# Patient Record
Sex: Female | Born: 1935 | Race: White | Hispanic: No | Marital: Single | Smoking: Former smoker
Health system: Southern US, Community
[De-identification: ages and names within clinical notes are randomized; demographics above are authoritative.]

## PROBLEM LIST (undated history)

## (undated) DIAGNOSIS — K219 Gastro-esophageal reflux disease without esophagitis: Secondary | ICD-10-CM

## (undated) DIAGNOSIS — M810 Age-related osteoporosis without current pathological fracture: Secondary | ICD-10-CM

## (undated) DIAGNOSIS — R112 Nausea with vomiting, unspecified: Secondary | ICD-10-CM

## (undated) DIAGNOSIS — M1990 Unspecified osteoarthritis, unspecified site: Secondary | ICD-10-CM

## (undated) DIAGNOSIS — Z8719 Personal history of other diseases of the digestive system: Secondary | ICD-10-CM

## (undated) DIAGNOSIS — Z9889 Other specified postprocedural states: Secondary | ICD-10-CM

## (undated) HISTORY — PX: OTHER SURGICAL HISTORY: SHX169

## (undated) HISTORY — PX: CATARACT EXTRACTION W/ INTRAOCULAR LENS  IMPLANT, BILATERAL: SHX1307

## (undated) HISTORY — PX: BUNIONECTOMY: SHX129

## (undated) HISTORY — PX: TUBAL LIGATION: SHX77

## (undated) HISTORY — PX: CERVICAL CONE BIOPSY: SUR198

## (undated) HISTORY — PX: APPENDECTOMY: SHX54

## (undated) HISTORY — PX: HEMORRHOID SURGERY: SHX153

## (undated) HISTORY — PX: CHOLECYSTECTOMY: SHX55

## (undated) HISTORY — PX: ERCP: SHX60

## (undated) HISTORY — PX: HERNIA REPAIR: SHX51

## (undated) HISTORY — PX: DILATION AND CURETTAGE OF UTERUS: SHX78

## (undated) HISTORY — PX: FOOT SURGERY: SHX648

---

## 1999-11-12 ENCOUNTER — Encounter: Admission: RE | Admit: 1999-11-12 | Discharge: 1999-11-12 | Payer: Self-pay | Admitting: Emergency Medicine

## 1999-11-12 ENCOUNTER — Encounter: Payer: Self-pay | Admitting: Emergency Medicine

## 1999-12-13 ENCOUNTER — Ambulatory Visit (HOSPITAL_COMMUNITY): Admission: RE | Admit: 1999-12-13 | Discharge: 1999-12-13 | Payer: Self-pay | Admitting: Gastroenterology

## 1999-12-14 ENCOUNTER — Encounter: Payer: Self-pay | Admitting: Gastroenterology

## 1999-12-14 ENCOUNTER — Ambulatory Visit (HOSPITAL_COMMUNITY): Admission: RE | Admit: 1999-12-14 | Discharge: 1999-12-14 | Payer: Self-pay | Admitting: Gastroenterology

## 2000-01-11 ENCOUNTER — Ambulatory Visit (HOSPITAL_COMMUNITY): Admission: RE | Admit: 2000-01-11 | Discharge: 2000-01-12 | Payer: Self-pay

## 2000-01-18 ENCOUNTER — Encounter: Payer: Self-pay | Admitting: Gastroenterology

## 2000-01-18 ENCOUNTER — Ambulatory Visit (HOSPITAL_COMMUNITY): Admission: RE | Admit: 2000-01-18 | Discharge: 2000-01-18 | Payer: Self-pay | Admitting: Gastroenterology

## 2000-01-19 ENCOUNTER — Observation Stay (HOSPITAL_COMMUNITY): Admission: EM | Admit: 2000-01-19 | Discharge: 2000-01-20 | Payer: Self-pay | Admitting: Emergency Medicine

## 2000-01-19 ENCOUNTER — Encounter: Payer: Self-pay | Admitting: Emergency Medicine

## 2000-04-08 ENCOUNTER — Other Ambulatory Visit: Admission: RE | Admit: 2000-04-08 | Discharge: 2000-04-08 | Payer: Self-pay | Admitting: Obstetrics and Gynecology

## 2000-09-15 ENCOUNTER — Encounter: Admission: RE | Admit: 2000-09-15 | Discharge: 2000-09-15 | Payer: Self-pay | Admitting: Pulmonary Disease

## 2000-09-15 ENCOUNTER — Encounter: Payer: Self-pay | Admitting: Pulmonary Disease

## 2000-11-12 ENCOUNTER — Encounter: Payer: Self-pay | Admitting: Obstetrics and Gynecology

## 2000-11-12 ENCOUNTER — Encounter: Admission: RE | Admit: 2000-11-12 | Discharge: 2000-11-12 | Payer: Self-pay | Admitting: Obstetrics and Gynecology

## 2001-04-09 ENCOUNTER — Other Ambulatory Visit: Admission: RE | Admit: 2001-04-09 | Discharge: 2001-04-09 | Payer: Self-pay | Admitting: Internal Medicine

## 2015-03-07 ENCOUNTER — Other Ambulatory Visit: Payer: Self-pay | Admitting: Family Medicine

## 2015-03-07 DIAGNOSIS — R059 Cough, unspecified: Secondary | ICD-10-CM

## 2015-03-07 DIAGNOSIS — R05 Cough: Secondary | ICD-10-CM

## 2015-03-09 ENCOUNTER — Ambulatory Visit
Admission: RE | Admit: 2015-03-09 | Discharge: 2015-03-09 | Disposition: A | Discharge status: Home / Self Care | Payer: PPO | Source: Ambulatory Visit | Attending: Family Medicine | Admitting: Family Medicine

## 2015-03-09 ENCOUNTER — Other Ambulatory Visit: Payer: Self-pay | Admitting: Otolaryngology

## 2015-03-09 DIAGNOSIS — R059 Cough, unspecified: Secondary | ICD-10-CM

## 2015-03-09 DIAGNOSIS — R05 Cough: Secondary | ICD-10-CM

## 2015-08-02 DIAGNOSIS — J301 Allergic rhinitis due to pollen: Secondary | ICD-10-CM | POA: Diagnosis not present

## 2015-08-02 DIAGNOSIS — R05 Cough: Secondary | ICD-10-CM | POA: Diagnosis not present

## 2015-08-02 DIAGNOSIS — J849 Interstitial pulmonary disease, unspecified: Secondary | ICD-10-CM | POA: Diagnosis not present

## 2015-08-03 DIAGNOSIS — Z01818 Encounter for other preprocedural examination: Secondary | ICD-10-CM | POA: Diagnosis not present

## 2015-08-07 DIAGNOSIS — R05 Cough: Secondary | ICD-10-CM | POA: Diagnosis not present

## 2015-08-07 DIAGNOSIS — J849 Interstitial pulmonary disease, unspecified: Secondary | ICD-10-CM | POA: Diagnosis not present

## 2015-08-10 DIAGNOSIS — J4530 Mild persistent asthma, uncomplicated: Secondary | ICD-10-CM | POA: Diagnosis not present

## 2015-08-10 DIAGNOSIS — R05 Cough: Secondary | ICD-10-CM | POA: Diagnosis not present

## 2015-08-10 DIAGNOSIS — J8489 Other specified interstitial pulmonary diseases: Secondary | ICD-10-CM | POA: Diagnosis not present

## 2015-08-10 DIAGNOSIS — J302 Other seasonal allergic rhinitis: Secondary | ICD-10-CM | POA: Diagnosis not present

## 2015-08-15 DIAGNOSIS — H903 Sensorineural hearing loss, bilateral: Secondary | ICD-10-CM | POA: Diagnosis not present

## 2015-08-24 DIAGNOSIS — W010XXA Fall on same level from slipping, tripping and stumbling without subsequent striking against object, initial encounter: Secondary | ICD-10-CM | POA: Diagnosis not present

## 2015-08-24 DIAGNOSIS — Z471 Aftercare following joint replacement surgery: Secondary | ICD-10-CM | POA: Diagnosis not present

## 2015-08-24 DIAGNOSIS — Y929 Unspecified place or not applicable: Secondary | ICD-10-CM | POA: Diagnosis not present

## 2015-08-24 DIAGNOSIS — D169 Benign neoplasm of bone and articular cartilage, unspecified: Secondary | ICD-10-CM | POA: Diagnosis not present

## 2015-08-24 DIAGNOSIS — Z8249 Family history of ischemic heart disease and other diseases of the circulatory system: Secondary | ICD-10-CM | POA: Diagnosis not present

## 2015-08-24 DIAGNOSIS — S79911A Unspecified injury of right hip, initial encounter: Secondary | ICD-10-CM | POA: Diagnosis not present

## 2015-08-24 DIAGNOSIS — S72011A Unspecified intracapsular fracture of right femur, initial encounter for closed fracture: Secondary | ICD-10-CM | POA: Diagnosis not present

## 2015-08-24 DIAGNOSIS — R0609 Other forms of dyspnea: Secondary | ICD-10-CM | POA: Diagnosis not present

## 2015-08-24 DIAGNOSIS — Z01818 Encounter for other preprocedural examination: Secondary | ICD-10-CM | POA: Diagnosis not present

## 2015-08-24 DIAGNOSIS — M25551 Pain in right hip: Secondary | ICD-10-CM | POA: Diagnosis not present

## 2015-08-24 DIAGNOSIS — Z87891 Personal history of nicotine dependence: Secondary | ICD-10-CM | POA: Diagnosis not present

## 2015-08-24 DIAGNOSIS — M810 Age-related osteoporosis without current pathological fracture: Secondary | ICD-10-CM | POA: Diagnosis not present

## 2015-08-24 DIAGNOSIS — M25571 Pain in right ankle and joints of right foot: Secondary | ICD-10-CM | POA: Diagnosis not present

## 2015-08-24 DIAGNOSIS — W11XXXA Fall on and from ladder, initial encounter: Secondary | ICD-10-CM | POA: Diagnosis not present

## 2015-08-24 DIAGNOSIS — S72001A Fracture of unspecified part of neck of right femur, initial encounter for closed fracture: Secondary | ICD-10-CM | POA: Diagnosis not present

## 2015-08-24 DIAGNOSIS — Z96641 Presence of right artificial hip joint: Secondary | ICD-10-CM | POA: Diagnosis not present

## 2015-08-24 DIAGNOSIS — Z79899 Other long term (current) drug therapy: Secondary | ICD-10-CM | POA: Diagnosis not present

## 2015-08-24 DIAGNOSIS — Z6822 Body mass index (BMI) 22.0-22.9, adult: Secondary | ICD-10-CM | POA: Diagnosis not present

## 2015-08-24 DIAGNOSIS — Z880 Allergy status to penicillin: Secondary | ICD-10-CM | POA: Diagnosis not present

## 2015-08-24 DIAGNOSIS — Z885 Allergy status to narcotic agent status: Secondary | ICD-10-CM | POA: Diagnosis not present

## 2015-08-25 DIAGNOSIS — S72001A Fracture of unspecified part of neck of right femur, initial encounter for closed fracture: Secondary | ICD-10-CM | POA: Diagnosis not present

## 2015-08-25 DIAGNOSIS — R0609 Other forms of dyspnea: Secondary | ICD-10-CM | POA: Diagnosis not present

## 2015-08-25 DIAGNOSIS — M79672 Pain in left foot: Secondary | ICD-10-CM | POA: Diagnosis not present

## 2015-08-25 DIAGNOSIS — Z6826 Body mass index (BMI) 26.0-26.9, adult: Secondary | ICD-10-CM | POA: Diagnosis not present

## 2015-08-25 DIAGNOSIS — S99922A Unspecified injury of left foot, initial encounter: Secondary | ICD-10-CM | POA: Diagnosis not present

## 2015-08-25 DIAGNOSIS — M810 Age-related osteoporosis without current pathological fracture: Secondary | ICD-10-CM | POA: Diagnosis not present

## 2015-08-26 DIAGNOSIS — Z6826 Body mass index (BMI) 26.0-26.9, adult: Secondary | ICD-10-CM | POA: Diagnosis not present

## 2015-08-26 DIAGNOSIS — S72001A Fracture of unspecified part of neck of right femur, initial encounter for closed fracture: Secondary | ICD-10-CM | POA: Diagnosis not present

## 2015-08-26 DIAGNOSIS — M810 Age-related osteoporosis without current pathological fracture: Secondary | ICD-10-CM | POA: Diagnosis not present

## 2015-08-26 DIAGNOSIS — R0602 Shortness of breath: Secondary | ICD-10-CM | POA: Diagnosis not present

## 2015-08-27 DIAGNOSIS — R0602 Shortness of breath: Secondary | ICD-10-CM | POA: Diagnosis not present

## 2015-08-27 DIAGNOSIS — M810 Age-related osteoporosis without current pathological fracture: Secondary | ICD-10-CM | POA: Diagnosis not present

## 2015-08-27 DIAGNOSIS — Z6826 Body mass index (BMI) 26.0-26.9, adult: Secondary | ICD-10-CM | POA: Diagnosis not present

## 2015-08-27 DIAGNOSIS — S72001A Fracture of unspecified part of neck of right femur, initial encounter for closed fracture: Secondary | ICD-10-CM | POA: Diagnosis not present

## 2015-08-28 DIAGNOSIS — R0602 Shortness of breath: Secondary | ICD-10-CM | POA: Diagnosis not present

## 2015-08-28 DIAGNOSIS — S72001A Fracture of unspecified part of neck of right femur, initial encounter for closed fracture: Secondary | ICD-10-CM | POA: Diagnosis not present

## 2015-08-28 DIAGNOSIS — M810 Age-related osteoporosis without current pathological fracture: Secondary | ICD-10-CM | POA: Diagnosis not present

## 2015-08-28 DIAGNOSIS — Z6826 Body mass index (BMI) 26.0-26.9, adult: Secondary | ICD-10-CM | POA: Diagnosis not present

## 2015-08-29 DIAGNOSIS — Z880 Allergy status to penicillin: Secondary | ICD-10-CM | POA: Diagnosis not present

## 2015-08-29 DIAGNOSIS — M1711 Unilateral primary osteoarthritis, right knee: Secondary | ICD-10-CM | POA: Diagnosis not present

## 2015-08-29 DIAGNOSIS — S72001A Fracture of unspecified part of neck of right femur, initial encounter for closed fracture: Secondary | ICD-10-CM | POA: Diagnosis not present

## 2015-08-29 DIAGNOSIS — M810 Age-related osteoporosis without current pathological fracture: Secondary | ICD-10-CM | POA: Diagnosis not present

## 2015-08-29 DIAGNOSIS — Z6826 Body mass index (BMI) 26.0-26.9, adult: Secondary | ICD-10-CM | POA: Diagnosis not present

## 2015-08-29 DIAGNOSIS — Z96641 Presence of right artificial hip joint: Secondary | ICD-10-CM | POA: Diagnosis not present

## 2015-08-29 DIAGNOSIS — Z471 Aftercare following joint replacement surgery: Secondary | ICD-10-CM | POA: Diagnosis not present

## 2015-08-29 DIAGNOSIS — R0609 Other forms of dyspnea: Secondary | ICD-10-CM | POA: Diagnosis not present

## 2015-08-30 DIAGNOSIS — Z6826 Body mass index (BMI) 26.0-26.9, adult: Secondary | ICD-10-CM | POA: Diagnosis not present

## 2015-08-30 DIAGNOSIS — S72001A Fracture of unspecified part of neck of right femur, initial encounter for closed fracture: Secondary | ICD-10-CM | POA: Diagnosis not present

## 2015-08-31 DIAGNOSIS — S72001A Fracture of unspecified part of neck of right femur, initial encounter for closed fracture: Secondary | ICD-10-CM | POA: Diagnosis not present

## 2015-08-31 DIAGNOSIS — Z6826 Body mass index (BMI) 26.0-26.9, adult: Secondary | ICD-10-CM | POA: Diagnosis not present

## 2015-09-01 DIAGNOSIS — S72001A Fracture of unspecified part of neck of right femur, initial encounter for closed fracture: Secondary | ICD-10-CM | POA: Diagnosis not present

## 2015-09-01 DIAGNOSIS — Z6826 Body mass index (BMI) 26.0-26.9, adult: Secondary | ICD-10-CM | POA: Diagnosis not present

## 2015-09-02 DIAGNOSIS — S72001A Fracture of unspecified part of neck of right femur, initial encounter for closed fracture: Secondary | ICD-10-CM | POA: Diagnosis not present

## 2015-09-02 DIAGNOSIS — Z6826 Body mass index (BMI) 26.0-26.9, adult: Secondary | ICD-10-CM | POA: Diagnosis not present

## 2015-09-04 DIAGNOSIS — Z6823 Body mass index (BMI) 23.0-23.9, adult: Secondary | ICD-10-CM | POA: Diagnosis not present

## 2015-09-04 DIAGNOSIS — S72001A Fracture of unspecified part of neck of right femur, initial encounter for closed fracture: Secondary | ICD-10-CM | POA: Diagnosis not present

## 2015-09-05 DIAGNOSIS — Z6823 Body mass index (BMI) 23.0-23.9, adult: Secondary | ICD-10-CM | POA: Diagnosis not present

## 2015-09-05 DIAGNOSIS — S7290XA Unspecified fracture of unspecified femur, initial encounter for closed fracture: Secondary | ICD-10-CM | POA: Diagnosis not present

## 2015-09-05 DIAGNOSIS — S72001A Fracture of unspecified part of neck of right femur, initial encounter for closed fracture: Secondary | ICD-10-CM | POA: Diagnosis not present

## 2015-09-06 DIAGNOSIS — Z9181 History of falling: Secondary | ICD-10-CM | POA: Diagnosis not present

## 2015-09-06 DIAGNOSIS — Z7951 Long term (current) use of inhaled steroids: Secondary | ICD-10-CM | POA: Diagnosis not present

## 2015-09-06 DIAGNOSIS — M1711 Unilateral primary osteoarthritis, right knee: Secondary | ICD-10-CM | POA: Diagnosis not present

## 2015-09-06 DIAGNOSIS — S72001D Fracture of unspecified part of neck of right femur, subsequent encounter for closed fracture with routine healing: Secondary | ICD-10-CM | POA: Diagnosis not present

## 2015-09-08 DIAGNOSIS — Z7951 Long term (current) use of inhaled steroids: Secondary | ICD-10-CM | POA: Diagnosis not present

## 2015-09-08 DIAGNOSIS — Z9181 History of falling: Secondary | ICD-10-CM | POA: Diagnosis not present

## 2015-09-08 DIAGNOSIS — M1711 Unilateral primary osteoarthritis, right knee: Secondary | ICD-10-CM | POA: Diagnosis not present

## 2015-09-08 DIAGNOSIS — S72001D Fracture of unspecified part of neck of right femur, subsequent encounter for closed fracture with routine healing: Secondary | ICD-10-CM | POA: Diagnosis not present

## 2015-09-11 DIAGNOSIS — S72001D Fracture of unspecified part of neck of right femur, subsequent encounter for closed fracture with routine healing: Secondary | ICD-10-CM | POA: Diagnosis not present

## 2015-09-11 DIAGNOSIS — Z9181 History of falling: Secondary | ICD-10-CM | POA: Diagnosis not present

## 2015-09-11 DIAGNOSIS — Z7951 Long term (current) use of inhaled steroids: Secondary | ICD-10-CM | POA: Diagnosis not present

## 2015-09-11 DIAGNOSIS — M1711 Unilateral primary osteoarthritis, right knee: Secondary | ICD-10-CM | POA: Diagnosis not present

## 2015-09-12 DIAGNOSIS — S72001D Fracture of unspecified part of neck of right femur, subsequent encounter for closed fracture with routine healing: Secondary | ICD-10-CM | POA: Diagnosis not present

## 2015-09-13 DIAGNOSIS — S72001D Fracture of unspecified part of neck of right femur, subsequent encounter for closed fracture with routine healing: Secondary | ICD-10-CM | POA: Diagnosis not present

## 2015-09-13 DIAGNOSIS — Z7951 Long term (current) use of inhaled steroids: Secondary | ICD-10-CM | POA: Diagnosis not present

## 2015-09-13 DIAGNOSIS — M1711 Unilateral primary osteoarthritis, right knee: Secondary | ICD-10-CM | POA: Diagnosis not present

## 2015-09-13 DIAGNOSIS — Z9181 History of falling: Secondary | ICD-10-CM | POA: Diagnosis not present

## 2015-09-14 DIAGNOSIS — M1711 Unilateral primary osteoarthritis, right knee: Secondary | ICD-10-CM | POA: Diagnosis not present

## 2015-09-14 DIAGNOSIS — Z7951 Long term (current) use of inhaled steroids: Secondary | ICD-10-CM | POA: Diagnosis not present

## 2015-09-14 DIAGNOSIS — Z9181 History of falling: Secondary | ICD-10-CM | POA: Diagnosis not present

## 2015-09-14 DIAGNOSIS — S72001D Fracture of unspecified part of neck of right femur, subsequent encounter for closed fracture with routine healing: Secondary | ICD-10-CM | POA: Diagnosis not present

## 2015-09-15 DIAGNOSIS — M1711 Unilateral primary osteoarthritis, right knee: Secondary | ICD-10-CM | POA: Diagnosis not present

## 2015-09-15 DIAGNOSIS — Z9181 History of falling: Secondary | ICD-10-CM | POA: Diagnosis not present

## 2015-09-15 DIAGNOSIS — S72001D Fracture of unspecified part of neck of right femur, subsequent encounter for closed fracture with routine healing: Secondary | ICD-10-CM | POA: Diagnosis not present

## 2015-09-15 DIAGNOSIS — Z7951 Long term (current) use of inhaled steroids: Secondary | ICD-10-CM | POA: Diagnosis not present

## 2015-09-18 DIAGNOSIS — Z9181 History of falling: Secondary | ICD-10-CM | POA: Diagnosis not present

## 2015-09-18 DIAGNOSIS — M1711 Unilateral primary osteoarthritis, right knee: Secondary | ICD-10-CM | POA: Diagnosis not present

## 2015-09-18 DIAGNOSIS — S72001D Fracture of unspecified part of neck of right femur, subsequent encounter for closed fracture with routine healing: Secondary | ICD-10-CM | POA: Diagnosis not present

## 2015-09-18 DIAGNOSIS — Z7951 Long term (current) use of inhaled steroids: Secondary | ICD-10-CM | POA: Diagnosis not present

## 2015-09-22 DIAGNOSIS — Z7951 Long term (current) use of inhaled steroids: Secondary | ICD-10-CM | POA: Diagnosis not present

## 2015-09-22 DIAGNOSIS — M1711 Unilateral primary osteoarthritis, right knee: Secondary | ICD-10-CM | POA: Diagnosis not present

## 2015-09-22 DIAGNOSIS — Z9181 History of falling: Secondary | ICD-10-CM | POA: Diagnosis not present

## 2015-09-22 DIAGNOSIS — S72001D Fracture of unspecified part of neck of right femur, subsequent encounter for closed fracture with routine healing: Secondary | ICD-10-CM | POA: Diagnosis not present

## 2015-10-03 ENCOUNTER — Other Ambulatory Visit: Payer: Self-pay

## 2015-10-03 NOTE — Patient Outreach (Signed)
[facility]) Care Management  10/03/2015  NEVA PRUE 1933/08/11 [id number]  SUBJECTIVE:  Telephone call to patient regarding [facility] referral.  HIPAA verified with patient. Discussed and offered [facility] care management services to patient. Patient states she does not have any needs at this time. Patient states she had hip surgery on August 24, 2015. Patient states she has done very well after having inpatient rehab and 2 weeks of home therapy.  Patient states she is not walking with any assistive devices.  Patient verbalized appreciation of the call and agreed to receive [facility] care management letter and brochure for future reference.   PLAN:  RNCM will refer patient to Josepha Pigg to close due to refusal of services.  RNCM will notify patients primary MD of refusal of services.  RNCM will send patient [facility] care management letter and brochure as discussed.  Quinn Plowman RN,BSN,CCM [facility] Telephonic  [phone]

## 2015-10-25 DIAGNOSIS — R490 Dysphonia: Secondary | ICD-10-CM | POA: Diagnosis not present

## 2015-10-25 DIAGNOSIS — Z880 Allergy status to penicillin: Secondary | ICD-10-CM | POA: Diagnosis not present

## 2015-10-25 DIAGNOSIS — Z79899 Other long term (current) drug therapy: Secondary | ICD-10-CM | POA: Diagnosis not present

## 2015-10-25 DIAGNOSIS — Z87891 Personal history of nicotine dependence: Secondary | ICD-10-CM | POA: Diagnosis not present

## 2015-10-25 DIAGNOSIS — R05 Cough: Secondary | ICD-10-CM | POA: Diagnosis not present

## 2015-10-25 DIAGNOSIS — J384 Edema of larynx: Secondary | ICD-10-CM | POA: Diagnosis not present

## 2015-10-25 DIAGNOSIS — J3801 Paralysis of vocal cords and larynx, unilateral: Secondary | ICD-10-CM | POA: Diagnosis not present

## 2015-11-07 DIAGNOSIS — J3801 Paralysis of vocal cords and larynx, unilateral: Secondary | ICD-10-CM | POA: Diagnosis not present

## 2015-11-07 DIAGNOSIS — R490 Dysphonia: Secondary | ICD-10-CM | POA: Diagnosis not present

## 2015-11-07 DIAGNOSIS — R05 Cough: Secondary | ICD-10-CM | POA: Diagnosis not present

## 2015-11-22 DIAGNOSIS — R490 Dysphonia: Secondary | ICD-10-CM | POA: Diagnosis not present

## 2015-11-22 DIAGNOSIS — R05 Cough: Secondary | ICD-10-CM | POA: Diagnosis not present

## 2015-11-22 DIAGNOSIS — J3801 Paralysis of vocal cords and larynx, unilateral: Secondary | ICD-10-CM | POA: Diagnosis not present

## 2015-12-04 DIAGNOSIS — R05 Cough: Secondary | ICD-10-CM | POA: Diagnosis not present

## 2015-12-04 DIAGNOSIS — J302 Other seasonal allergic rhinitis: Secondary | ICD-10-CM | POA: Diagnosis not present

## 2015-12-18 DIAGNOSIS — M810 Age-related osteoporosis without current pathological fracture: Secondary | ICD-10-CM | POA: Diagnosis not present

## 2015-12-18 DIAGNOSIS — Z780 Asymptomatic menopausal state: Secondary | ICD-10-CM | POA: Diagnosis not present

## 2015-12-25 DIAGNOSIS — M810 Age-related osteoporosis without current pathological fracture: Secondary | ICD-10-CM | POA: Diagnosis not present

## 2016-01-08 DIAGNOSIS — M810 Age-related osteoporosis without current pathological fracture: Secondary | ICD-10-CM | POA: Diagnosis not present

## 2016-01-18 DIAGNOSIS — H3540 Unspecified peripheral retinal degeneration: Secondary | ICD-10-CM | POA: Diagnosis not present

## 2016-01-18 DIAGNOSIS — H04123 Dry eye syndrome of bilateral lacrimal glands: Secondary | ICD-10-CM | POA: Diagnosis not present

## 2016-01-18 DIAGNOSIS — H35461 Secondary vitreoretinal degeneration, right eye: Secondary | ICD-10-CM | POA: Diagnosis not present

## 2016-01-18 DIAGNOSIS — H5201 Hypermetropia, right eye: Secondary | ICD-10-CM | POA: Diagnosis not present

## 2016-01-18 DIAGNOSIS — H52223 Regular astigmatism, bilateral: Secondary | ICD-10-CM | POA: Diagnosis not present

## 2016-01-18 DIAGNOSIS — Z961 Presence of intraocular lens: Secondary | ICD-10-CM | POA: Diagnosis not present

## 2016-01-18 DIAGNOSIS — H5212 Myopia, left eye: Secondary | ICD-10-CM | POA: Diagnosis not present

## 2016-02-09 DIAGNOSIS — M1711 Unilateral primary osteoarthritis, right knee: Secondary | ICD-10-CM | POA: Diagnosis not present

## 2016-02-09 DIAGNOSIS — M25561 Pain in right knee: Secondary | ICD-10-CM | POA: Diagnosis not present

## 2016-05-14 DIAGNOSIS — J4520 Mild intermittent asthma, uncomplicated: Secondary | ICD-10-CM | POA: Diagnosis not present

## 2016-05-14 DIAGNOSIS — Z0000 Encounter for general adult medical examination without abnormal findings: Secondary | ICD-10-CM | POA: Diagnosis not present

## 2016-05-14 DIAGNOSIS — Z79899 Other long term (current) drug therapy: Secondary | ICD-10-CM | POA: Diagnosis not present

## 2016-05-14 DIAGNOSIS — E782 Mixed hyperlipidemia: Secondary | ICD-10-CM | POA: Diagnosis not present

## 2016-05-14 DIAGNOSIS — M810 Age-related osteoporosis without current pathological fracture: Secondary | ICD-10-CM | POA: Diagnosis not present

## 2016-07-09 DIAGNOSIS — H9113 Presbycusis, bilateral: Secondary | ICD-10-CM | POA: Diagnosis not present

## 2016-07-12 DIAGNOSIS — M810 Age-related osteoporosis without current pathological fracture: Secondary | ICD-10-CM | POA: Diagnosis not present

## 2016-07-23 DIAGNOSIS — Z1212 Encounter for screening for malignant neoplasm of rectum: Secondary | ICD-10-CM | POA: Diagnosis not present

## 2016-07-24 DIAGNOSIS — H16223 Keratoconjunctivitis sicca, not specified as Sjogren's, bilateral: Secondary | ICD-10-CM | POA: Diagnosis not present

## 2016-07-24 DIAGNOSIS — H16143 Punctate keratitis, bilateral: Secondary | ICD-10-CM | POA: Diagnosis not present

## 2016-07-24 DIAGNOSIS — H01003 Unspecified blepharitis right eye, unspecified eyelid: Secondary | ICD-10-CM | POA: Diagnosis not present

## 2016-08-23 DIAGNOSIS — K219 Gastro-esophageal reflux disease without esophagitis: Secondary | ICD-10-CM | POA: Diagnosis not present

## 2016-08-23 DIAGNOSIS — K5909 Other constipation: Secondary | ICD-10-CM | POA: Diagnosis not present

## 2016-08-23 DIAGNOSIS — R195 Other fecal abnormalities: Secondary | ICD-10-CM | POA: Diagnosis not present

## 2016-08-23 DIAGNOSIS — Z800 Family history of malignant neoplasm of digestive organs: Secondary | ICD-10-CM | POA: Diagnosis not present

## 2016-09-06 DIAGNOSIS — K219 Gastro-esophageal reflux disease without esophagitis: Secondary | ICD-10-CM | POA: Diagnosis not present

## 2016-09-06 DIAGNOSIS — K5730 Diverticulosis of large intestine without perforation or abscess without bleeding: Secondary | ICD-10-CM | POA: Diagnosis not present

## 2016-09-06 DIAGNOSIS — Z800 Family history of malignant neoplasm of digestive organs: Secondary | ICD-10-CM | POA: Diagnosis not present

## 2016-09-06 DIAGNOSIS — K5790 Diverticulosis of intestine, part unspecified, without perforation or abscess without bleeding: Secondary | ICD-10-CM | POA: Diagnosis not present

## 2016-09-06 DIAGNOSIS — K921 Melena: Secondary | ICD-10-CM | POA: Diagnosis not present

## 2016-09-06 DIAGNOSIS — K648 Other hemorrhoids: Secondary | ICD-10-CM | POA: Diagnosis not present

## 2016-09-11 DIAGNOSIS — M25561 Pain in right knee: Secondary | ICD-10-CM | POA: Diagnosis not present

## 2016-09-11 DIAGNOSIS — Z6823 Body mass index (BMI) 23.0-23.9, adult: Secondary | ICD-10-CM | POA: Diagnosis not present

## 2016-09-11 DIAGNOSIS — M1711 Unilateral primary osteoarthritis, right knee: Secondary | ICD-10-CM | POA: Diagnosis not present

## 2016-09-18 DIAGNOSIS — H16143 Punctate keratitis, bilateral: Secondary | ICD-10-CM | POA: Diagnosis not present

## 2016-09-18 DIAGNOSIS — H01003 Unspecified blepharitis right eye, unspecified eyelid: Secondary | ICD-10-CM | POA: Diagnosis not present

## 2016-09-18 DIAGNOSIS — H16223 Keratoconjunctivitis sicca, not specified as Sjogren's, bilateral: Secondary | ICD-10-CM | POA: Diagnosis not present

## 2016-10-04 DIAGNOSIS — T148XXA Other injury of unspecified body region, initial encounter: Secondary | ICD-10-CM | POA: Diagnosis not present

## 2016-11-27 DIAGNOSIS — R1010 Upper abdominal pain, unspecified: Secondary | ICD-10-CM | POA: Diagnosis not present

## 2016-11-28 DIAGNOSIS — R1010 Upper abdominal pain, unspecified: Secondary | ICD-10-CM | POA: Diagnosis not present

## 2016-12-09 DIAGNOSIS — M25561 Pain in right knee: Secondary | ICD-10-CM | POA: Diagnosis not present

## 2016-12-09 DIAGNOSIS — Z6823 Body mass index (BMI) 23.0-23.9, adult: Secondary | ICD-10-CM | POA: Diagnosis not present

## 2016-12-09 DIAGNOSIS — M1711 Unilateral primary osteoarthritis, right knee: Secondary | ICD-10-CM | POA: Diagnosis not present

## 2017-03-07 DIAGNOSIS — S86211A Strain of muscle(s) and tendon(s) of anterior muscle group at lower leg level, right leg, initial encounter: Secondary | ICD-10-CM | POA: Diagnosis not present

## 2017-03-07 DIAGNOSIS — M1711 Unilateral primary osteoarthritis, right knee: Secondary | ICD-10-CM | POA: Diagnosis not present

## 2017-03-07 DIAGNOSIS — M79661 Pain in right lower leg: Secondary | ICD-10-CM | POA: Diagnosis not present

## 2017-03-07 DIAGNOSIS — M19071 Primary osteoarthritis, right ankle and foot: Secondary | ICD-10-CM | POA: Diagnosis not present

## 2017-03-07 DIAGNOSIS — M7671 Peroneal tendinitis, right leg: Secondary | ICD-10-CM | POA: Diagnosis not present

## 2017-04-21 DIAGNOSIS — H16223 Keratoconjunctivitis sicca, not specified as Sjogren's, bilateral: Secondary | ICD-10-CM | POA: Diagnosis not present

## 2017-04-21 DIAGNOSIS — H26493 Other secondary cataract, bilateral: Secondary | ICD-10-CM | POA: Diagnosis not present

## 2017-04-21 DIAGNOSIS — H26492 Other secondary cataract, left eye: Secondary | ICD-10-CM | POA: Diagnosis not present

## 2017-04-21 DIAGNOSIS — H01009 Unspecified blepharitis unspecified eye, unspecified eyelid: Secondary | ICD-10-CM | POA: Diagnosis not present

## 2017-04-21 DIAGNOSIS — H35371 Puckering of macula, right eye: Secondary | ICD-10-CM | POA: Diagnosis not present

## 2017-04-28 DIAGNOSIS — H26492 Other secondary cataract, left eye: Secondary | ICD-10-CM | POA: Diagnosis not present

## 2017-06-19 DIAGNOSIS — G8929 Other chronic pain: Secondary | ICD-10-CM | POA: Diagnosis not present

## 2017-06-19 DIAGNOSIS — M25561 Pain in right knee: Secondary | ICD-10-CM | POA: Diagnosis not present

## 2017-06-19 DIAGNOSIS — M25461 Effusion, right knee: Secondary | ICD-10-CM | POA: Diagnosis not present

## 2017-06-19 DIAGNOSIS — M21062 Valgus deformity, not elsewhere classified, left knee: Secondary | ICD-10-CM | POA: Diagnosis not present

## 2017-06-19 DIAGNOSIS — M85861 Other specified disorders of bone density and structure, right lower leg: Secondary | ICD-10-CM | POA: Diagnosis not present

## 2017-06-19 DIAGNOSIS — M1711 Unilateral primary osteoarthritis, right knee: Secondary | ICD-10-CM | POA: Diagnosis not present

## 2017-07-01 DIAGNOSIS — E782 Mixed hyperlipidemia: Secondary | ICD-10-CM | POA: Diagnosis not present

## 2017-07-01 DIAGNOSIS — Z79899 Other long term (current) drug therapy: Secondary | ICD-10-CM | POA: Diagnosis not present

## 2017-07-01 DIAGNOSIS — M810 Age-related osteoporosis without current pathological fracture: Secondary | ICD-10-CM | POA: Diagnosis not present

## 2017-07-01 DIAGNOSIS — Z0000 Encounter for general adult medical examination without abnormal findings: Secondary | ICD-10-CM | POA: Diagnosis not present

## 2017-07-01 DIAGNOSIS — K219 Gastro-esophageal reflux disease without esophagitis: Secondary | ICD-10-CM | POA: Diagnosis not present

## 2017-07-01 DIAGNOSIS — Z01810 Encounter for preprocedural cardiovascular examination: Secondary | ICD-10-CM | POA: Diagnosis not present

## 2017-07-02 DIAGNOSIS — E782 Mixed hyperlipidemia: Secondary | ICD-10-CM | POA: Diagnosis not present

## 2017-07-07 ENCOUNTER — Other Ambulatory Visit: Payer: Self-pay | Admitting: Orthopedic Surgery

## 2017-08-08 ENCOUNTER — Encounter (HOSPITAL_COMMUNITY): Payer: Self-pay | Admitting: *Deleted

## 2017-08-08 ENCOUNTER — Encounter (HOSPITAL_COMMUNITY)
Admission: RE | Admit: 2017-08-08 | Discharge: 2017-08-08 | Disposition: A | Discharge status: Home / Self Care | Payer: PPO | Source: Ambulatory Visit | Attending: Orthopedic Surgery | Admitting: Orthopedic Surgery

## 2017-08-08 ENCOUNTER — Other Ambulatory Visit: Payer: Self-pay

## 2017-08-08 DIAGNOSIS — Z01812 Encounter for preprocedural laboratory examination: Secondary | ICD-10-CM | POA: Diagnosis not present

## 2017-08-08 HISTORY — DX: Personal history of other diseases of the digestive system: Z87.19

## 2017-08-08 HISTORY — DX: Nausea with vomiting, unspecified: R11.2

## 2017-08-08 HISTORY — DX: Gastro-esophageal reflux disease without esophagitis: K21.9

## 2017-08-08 HISTORY — DX: Other specified postprocedural states: Z98.890

## 2017-08-08 HISTORY — DX: Unspecified osteoarthritis, unspecified site: M19.90

## 2017-08-08 HISTORY — DX: Age-related osteoporosis without current pathological fracture: M81.0

## 2017-08-08 LAB — CBC WITH DIFFERENTIAL/PLATELET
BASOS ABS: 0 10*3/uL (ref 0.0–0.1)
Basophils Relative: 1 %
Eosinophils Absolute: 0.2 10*3/uL (ref 0.0–0.7)
Eosinophils Relative: 2 %
HEMATOCRIT: 40 % (ref 36.0–46.0)
HEMOGLOBIN: 13.4 g/dL (ref 12.0–15.0)
LYMPHS PCT: 32 %
Lymphs Abs: 2 10*3/uL (ref 0.7–4.0)
MCH: 31.3 pg (ref 26.0–34.0)
MCHC: 33.5 g/dL (ref 30.0–36.0)
MCV: 93.5 fL (ref 78.0–100.0)
MONO ABS: 0.5 10*3/uL (ref 0.1–1.0)
Monocytes Relative: 8 %
NEUTROS PCT: 57 %
Neutro Abs: 3.5 10*3/uL (ref 1.7–7.7)
Platelets: 266 10*3/uL (ref 150–400)
RBC: 4.28 MIL/uL (ref 3.87–5.11)
RDW: 12.3 % (ref 11.5–15.5)
WBC: 6.1 10*3/uL (ref 4.0–10.5)

## 2017-08-08 LAB — COMPREHENSIVE METABOLIC PANEL
ALT: 13 U/L — AB (ref 14–54)
AST: 24 U/L (ref 15–41)
Albumin: 4.2 g/dL (ref 3.5–5.0)
Alkaline Phosphatase: 86 U/L (ref 38–126)
Anion gap: 12 (ref 5–15)
BILIRUBIN TOTAL: 0.4 mg/dL (ref 0.3–1.2)
BUN: 14 mg/dL (ref 6–20)
CO2: 22 mmol/L (ref 22–32)
CREATININE: 0.81 mg/dL (ref 0.44–1.00)
Calcium: 9.3 mg/dL (ref 8.9–10.3)
Chloride: 106 mmol/L (ref 101–111)
GFR calc Af Amer: >60 mL/min (ref >60)
GLUCOSE: 90 mg/dL (ref 65–99)
Potassium: 4.2 mmol/L (ref 3.5–5.1)
Sodium: 140 mmol/L (ref 135–145)
TOTAL PROTEIN: 6.7 g/dL (ref 6.5–8.1)

## 2017-08-08 LAB — SURGICAL PCR SCREEN
MRSA, PCR: NEGATIVE
Staphylococcus aureus: NEGATIVE

## 2017-08-08 NOTE — Pre-Procedure Instructions (Signed)
Breanna Montes  08/08/2017      [facility] [place], [address] [place] [address] Phone: [phone] Fax: [phone]    Your procedure is scheduled on  Monday 08/18/17  Report to [facility] Admitting at 615 A.M.  Call this number if you have problems the morning of surgery:  [phone]   Remember:  Do not eat food or drink liquids after midnight.  Take these medicines the morning of surgery with A SIP OF WATER - EYE DROPS IF NEEDED, OMEPRAZOLE (PRILOSEC)   Do not wear jewelry, make-up or nail polish.  Do not wear lotions, powders, or perfumes, or deodorant.  Do not shave 48 hours prior to surgery.  Men may shave face and neck.  Do not bring valuables to the hospital.  [facility] is not responsible for any belongings or valuables.  Contacts, dentures or bridgework may not be worn into surgery.  Leave your suitcase in the car.  After surgery it may be brought to your room.  For patients admitted to the hospital, discharge time will be determined by your treatment team.  Patients discharged the day of surgery will not be allowed to drive home.   Name and phone number of your driver:    Special instructions:  [place] - Preparing for Surgery  Before surgery, you can play an important role.  Because skin is not sterile, your skin needs to be as free of germs as possible.  You can reduce the number of germs on you skin by washing with CHG (chlorahexidine gluconate) soap before surgery.  CHG is an antiseptic cleaner which kills germs and bonds with the skin to continue killing germs even after washing.  Please DO NOT use if you have an allergy to CHG or antibacterial soaps.  If your skin becomes reddened/irritated stop using the CHG and inform your nurse when you arrive at Short Stay.  Do not shave (including legs and underarms) for at least 48 hours prior to the first CHG shower.  You may shave your  face.  Please follow these instructions carefully:   1.  Shower with CHG Soap the night before surgery and the                                morning of Surgery.  2.  If you choose to wash your hair, wash your hair first as usual with your       normal shampoo.  3.  After you shampoo, rinse your hair and body thoroughly to remove the                      Shampoo.  4.  Use CHG as you would any other liquid soap.  You can apply chg directly       to the skin and wash gently with scrungie or a clean washcloth.  5.  Apply the CHG Soap to your body ONLY FROM THE NECK DOWN.        Do not use on open wounds or open sores.  Avoid contact with your eyes,       ears, mouth and genitals (private parts).  Wash genitals (private parts)       with your normal soap.  6.  Wash thoroughly, paying special attention to the area where your surgery  will be performed.  7.  Thoroughly rinse your body with warm water from the neck down.  8.  DO NOT shower/wash with your normal soap after using and rinsing off       the CHG Soap.  9.  Pat yourself dry with a clean towel.            10.  Wear clean pajamas.            11.  Place clean sheets on your bed the night of your first shower and do not        sleep with pets.  Day of Surgery  Do not apply any lotions/deoderants the morning of surgery.  Please wear clean clothes to the hospital/surgery center.    Please read over the following fact sheets that you were given. MRSA Information and Surgical Site Infection Prevention

## 2017-08-12 DIAGNOSIS — Z1231 Encounter for screening mammogram for malignant neoplasm of breast: Secondary | ICD-10-CM | POA: Diagnosis not present

## 2017-08-15 MED ORDER — DEXAMETHASONE SODIUM PHOSPHATE 10 MG/ML IJ SOLN: 8.0000 mg | INTRAMUSCULAR | Status: DC

## 2017-08-15 MED ORDER — GABAPENTIN 300 MG PO CAPS
300.0000 mg | ORAL_CAPSULE | ORAL | Status: AC
  Administered 2017-08-18: 300 mg via ORAL
  Filled 2017-08-15: qty 1

## 2017-08-15 MED ORDER — BUPIVACAINE LIPOSOME 1.3 % IJ SUSP
20.0000 mL | INTRAMUSCULAR | Status: AC
  Administered 2017-08-18: 20 mL
  Filled 2017-08-15: qty 20

## 2017-08-15 MED ORDER — ACETAMINOPHEN 500 MG PO TABS
1000.0000 mg | ORAL_TABLET | Freq: Once | ORAL | Status: AC
  Administered 2017-08-18: 1000 mg via ORAL
  Filled 2017-08-15: qty 2

## 2017-08-15 MED ORDER — TRANEXAMIC ACID 1000 MG/10ML IV SOLN
1000.0000 mg | INTRAVENOUS | Status: DC
  Filled 2017-08-15: qty 10

## 2017-08-17 NOTE — Anesthesia Preprocedure Evaluation (Addendum)
Anesthesia Evaluation  Patient identified by MRN, date of birth, ID band Patient awake    Reviewed: Allergy & Precautions, NPO status , Patient's Chart, lab work & pertinent test results  History of Anesthesia Complications (+) PONV  Airway Mallampati: II  TM Distance: >3 FB Neck ROM: Full    Dental no notable dental hx. (+) Dental Advisory Given   Pulmonary former smoker,    Pulmonary exam normal breath sounds clear to auscultation       Cardiovascular negative cardio ROS Normal cardiovascular exam Rhythm:Regular Rate:Normal     Neuro/Psych negative neurological ROS     GI/Hepatic Neg liver ROS, GERD  ,  Endo/Other    Renal/GU negative Renal ROS     Musculoskeletal   Abdominal   Peds  Hematology   Anesthesia Other Findings   Reproductive/Obstetrics                           Lab Results  Component Value Date   WBC 6.1 08/08/2017   HGB 13.4 08/08/2017   HCT 40.0 08/08/2017   MCV 93.5 08/08/2017   PLT 266 08/08/2017   Lab Results  Component Value Date   CREATININE 0.81 08/08/2017   BUN 14 08/08/2017   NA 140 08/08/2017   K 4.2 08/08/2017   CL 106 08/08/2017   CO2 22 08/08/2017   Anesthesia Physical Anesthesia Plan  ASA: II  Anesthesia Plan: Spinal   Post-op Pain Management:  Regional for Post-op pain   Induction:   PONV Risk Score and Plan: 2 and Treatment may vary due to age or medical condition, Ondansetron and Dexamethasone  Airway Management Planned: Mask, Natural Airway and Nasal Cannula  Additional Equipment:   Intra-op Plan:   Post-operative Plan:   Informed Consent: I have reviewed the patients History and Physical, chart, labs and discussed the procedure including the risks, benefits and alternatives for the proposed anesthesia with the patient or authorized representative who has indicated his/her understanding and acceptance.     Plan Discussed with:  CRNA  Anesthesia Plan Comments:         Anesthesia Quick Evaluation

## 2017-08-18 ENCOUNTER — Encounter (HOSPITAL_COMMUNITY)
Admission: AD | Disposition: A | Discharge status: Home Health | Payer: Self-pay | Source: Ambulatory Visit | Attending: Orthopedic Surgery

## 2017-08-18 ENCOUNTER — Ambulatory Visit (HOSPITAL_COMMUNITY): Payer: PPO | Admitting: Anesthesiology

## 2017-08-18 ENCOUNTER — Inpatient Hospital Stay (HOSPITAL_COMMUNITY)
Admission: AD | Admit: 2017-08-18 | Discharge: 2017-08-20 | DRG: 470 | Disposition: A | Discharge status: Home Health | Payer: PPO | Source: Ambulatory Visit | Attending: Orthopedic Surgery | Admitting: Orthopedic Surgery

## 2017-08-18 ENCOUNTER — Encounter (HOSPITAL_COMMUNITY): Payer: Self-pay | Admitting: Certified Registered"

## 2017-08-18 DIAGNOSIS — Z961 Presence of intraocular lens: Secondary | ICD-10-CM | POA: Diagnosis present

## 2017-08-18 DIAGNOSIS — M1711 Unilateral primary osteoarthritis, right knee: Principal | ICD-10-CM | POA: Diagnosis present

## 2017-08-18 DIAGNOSIS — Z96659 Presence of unspecified artificial knee joint: Secondary | ICD-10-CM

## 2017-08-18 DIAGNOSIS — Z885 Allergy status to narcotic agent status: Secondary | ICD-10-CM | POA: Diagnosis not present

## 2017-08-18 DIAGNOSIS — Z87891 Personal history of nicotine dependence: Secondary | ICD-10-CM

## 2017-08-18 DIAGNOSIS — Z880 Allergy status to penicillin: Secondary | ICD-10-CM

## 2017-08-18 DIAGNOSIS — G8918 Other acute postprocedural pain: Secondary | ICD-10-CM | POA: Diagnosis not present

## 2017-08-18 DIAGNOSIS — M810 Age-related osteoporosis without current pathological fracture: Secondary | ICD-10-CM | POA: Diagnosis present

## 2017-08-18 DIAGNOSIS — Z9049 Acquired absence of other specified parts of digestive tract: Secondary | ICD-10-CM

## 2017-08-18 DIAGNOSIS — K219 Gastro-esophageal reflux disease without esophagitis: Secondary | ICD-10-CM | POA: Diagnosis present

## 2017-08-18 DIAGNOSIS — M25761 Osteophyte, right knee: Secondary | ICD-10-CM | POA: Diagnosis present

## 2017-08-18 DIAGNOSIS — M25561 Pain in right knee: Secondary | ICD-10-CM | POA: Diagnosis present

## 2017-08-18 DIAGNOSIS — Z79899 Other long term (current) drug therapy: Secondary | ICD-10-CM

## 2017-08-18 HISTORY — PX: TOTAL KNEE ARTHROPLASTY: SHX125

## 2017-08-18 SURGERY — ARTHROPLASTY, KNEE, TOTAL
Anesthesia: Regional | Site: Knee | Laterality: Right

## 2017-08-18 MED ORDER — CHLORHEXIDINE GLUCONATE 4 % EX LIQD: 60.0000 mL | Freq: Once | CUTANEOUS | Status: DC

## 2017-08-18 MED ORDER — FLEET ENEMA 7-19 GM/118ML RE ENEM: 1.0000 | ENEMA | Freq: Once | RECTAL | Status: DC | PRN

## 2017-08-18 MED ORDER — DEXAMETHASONE SODIUM PHOSPHATE 10 MG/ML IJ SOLN
INTRAMUSCULAR | Status: AC
  Filled 2017-08-18: qty 1

## 2017-08-18 MED ORDER — FENTANYL CITRATE (PF) 250 MCG/5ML IJ SOLN
INTRAMUSCULAR | Status: AC
Start: 2017-08-18 — End: 2017-08-18
  Filled 2017-08-18: qty 5

## 2017-08-18 MED ORDER — METHOCARBAMOL 500 MG PO TABS
500.0000 mg | ORAL_TABLET | Freq: Four times a day (QID) | ORAL | Status: DC | PRN
  Administered 2017-08-18 – 2017-08-20 (×4): 500 mg via ORAL
  Filled 2017-08-18 (×4): qty 1

## 2017-08-18 MED ORDER — CLINDAMYCIN PHOSPHATE 600 MG/50ML IV SOLN
600.0000 mg | Freq: Four times a day (QID) | INTRAVENOUS | Status: AC
  Administered 2017-08-18 (×2): 600 mg via INTRAVENOUS
  Filled 2017-08-18 (×3): qty 50

## 2017-08-18 MED ORDER — MIDAZOLAM HCL 2 MG/2ML IJ SOLN
INTRAMUSCULAR | Status: AC
  Filled 2017-08-18: qty 2

## 2017-08-18 MED ORDER — FENTANYL CITRATE (PF) 100 MCG/2ML IJ SOLN
INTRAMUSCULAR | Status: AC
  Administered 2017-08-18: 50 ug
  Filled 2017-08-18: qty 2

## 2017-08-18 MED ORDER — PHENOL 1.4 % MT LIQD: 1.0000 | OROMUCOSAL | Status: DC | PRN

## 2017-08-18 MED ORDER — CYCLOSPORINE 0.05 % OP EMUL: 1.0000 [drp] | Freq: Four times a day (QID) | OPHTHALMIC | Status: DC | PRN

## 2017-08-18 MED ORDER — ALUM & MAG HYDROXIDE-SIMETH 200-200-20 MG/5ML PO SUSP: 30.0000 mL | ORAL | Status: DC | PRN

## 2017-08-18 MED ORDER — GABAPENTIN 300 MG PO CAPS
300.0000 mg | ORAL_CAPSULE | Freq: Three times a day (TID) | ORAL | Status: DC
  Administered 2017-08-18 – 2017-08-20 (×6): 300 mg via ORAL
  Filled 2017-08-18 (×6): qty 1

## 2017-08-18 MED ORDER — LIDOCAINE 2% (20 MG/ML) 5 ML SYRINGE
INTRAMUSCULAR | Status: AC
  Filled 2017-08-18: qty 5

## 2017-08-18 MED ORDER — ONDANSETRON HCL 4 MG/2ML IJ SOLN
INTRAMUSCULAR | Status: DC | PRN
  Administered 2017-08-18: 4 mg via INTRAVENOUS

## 2017-08-18 MED ORDER — LIDOCAINE 2% (20 MG/ML) 5 ML SYRINGE
INTRAMUSCULAR | Status: DC | PRN
  Administered 2017-08-18: 40 mg via INTRAVENOUS

## 2017-08-18 MED ORDER — LACTATED RINGERS IV SOLN: INTRAVENOUS | Status: DC

## 2017-08-18 MED ORDER — DOCUSATE SODIUM 100 MG PO CAPS
100.0000 mg | ORAL_CAPSULE | Freq: Two times a day (BID) | ORAL | Status: DC
  Administered 2017-08-18 – 2017-08-20 (×5): 100 mg via ORAL
  Filled 2017-08-18 (×5): qty 1

## 2017-08-18 MED ORDER — TRANEXAMIC ACID 1000 MG/10ML IV SOLN
INTRAVENOUS | Status: DC | PRN
  Administered 2017-08-18: 1000 mg via INTRAVENOUS

## 2017-08-18 MED ORDER — TRANEXAMIC ACID 1000 MG/10ML IV SOLN
1000.0000 mg | Freq: Once | INTRAVENOUS | Status: AC
  Administered 2017-08-18: 1000 mg via INTRAVENOUS
  Filled 2017-08-18: qty 10

## 2017-08-18 MED ORDER — ASPIRIN EC 325 MG PO TBEC
325.0000 mg | DELAYED_RELEASE_TABLET | Freq: Two times a day (BID) | ORAL | Status: DC
  Administered 2017-08-18 – 2017-08-20 (×5): 325 mg via ORAL
  Filled 2017-08-18 (×5): qty 1

## 2017-08-18 MED ORDER — ACETAMINOPHEN 500 MG PO TABS
1000.0000 mg | ORAL_TABLET | Freq: Four times a day (QID) | ORAL | Status: AC
  Administered 2017-08-18 – 2017-08-19 (×4): 1000 mg via ORAL
  Filled 2017-08-18 (×4): qty 2

## 2017-08-18 MED ORDER — SODIUM CHLORIDE 0.9 % IR SOLN
Status: DC | PRN
  Administered 2017-08-18: 3000 mL

## 2017-08-18 MED ORDER — BUPIVACAINE IN DEXTROSE 0.75-8.25 % IT SOLN
INTRATHECAL | Status: DC | PRN
  Administered 2017-08-18: 15 mg via INTRATHECAL

## 2017-08-18 MED ORDER — ONDANSETRON HCL 4 MG/2ML IJ SOLN
4.0000 mg | Freq: Four times a day (QID) | INTRAMUSCULAR | Status: DC | PRN
  Administered 2017-08-18: 4 mg via INTRAVENOUS
  Filled 2017-08-18: qty 2

## 2017-08-18 MED ORDER — METOCLOPRAMIDE HCL 5 MG PO TABS: 5.0000 mg | ORAL_TABLET | Freq: Three times a day (TID) | ORAL | Status: DC | PRN

## 2017-08-18 MED ORDER — PHENYLEPHRINE 40 MCG/ML (10ML) SYRINGE FOR IV PUSH (FOR BLOOD PRESSURE SUPPORT)
PREFILLED_SYRINGE | INTRAVENOUS | Status: DC | PRN
  Administered 2017-08-18 (×3): 80 ug via INTRAVENOUS

## 2017-08-18 MED ORDER — ONDANSETRON HCL 4 MG/2ML IJ SOLN
INTRAMUSCULAR | Status: AC
  Filled 2017-08-18: qty 2

## 2017-08-18 MED ORDER — SENNOSIDES-DOCUSATE SODIUM 8.6-50 MG PO TABS: 1.0000 | ORAL_TABLET | Freq: Every evening | ORAL | Status: DC | PRN

## 2017-08-18 MED ORDER — DEXAMETHASONE SODIUM PHOSPHATE 4 MG/ML IJ SOLN
INTRAMUSCULAR | Status: DC | PRN
  Administered 2017-08-18: 8 mg via INTRAVENOUS

## 2017-08-18 MED ORDER — METOCLOPRAMIDE HCL 5 MG/ML IJ SOLN
5.0000 mg | Freq: Three times a day (TID) | INTRAMUSCULAR | Status: DC | PRN
  Administered 2017-08-18: 5 mg via INTRAVENOUS
  Filled 2017-08-18: qty 2

## 2017-08-18 MED ORDER — BUPIVACAINE-EPINEPHRINE 0.5% -1:200000 IJ SOLN
INTRAMUSCULAR | Status: DC | PRN
  Administered 2017-08-18: 30 mL

## 2017-08-18 MED ORDER — PANTOPRAZOLE SODIUM 40 MG PO TBEC: 40.0000 mg | DELAYED_RELEASE_TABLET | Freq: Every day | ORAL | Status: DC

## 2017-08-18 MED ORDER — PROPOFOL 500 MG/50ML IV EMUL
INTRAVENOUS | Status: DC | PRN
  Administered 2017-08-18: 50 ug/kg/min via INTRAVENOUS

## 2017-08-18 MED ORDER — 0.9 % SODIUM CHLORIDE (POUR BTL) OPTIME
TOPICAL | Status: DC | PRN
  Administered 2017-08-18: 1000 mL

## 2017-08-18 MED ORDER — MENTHOL 3 MG MT LOZG: 1.0000 | LOZENGE | OROMUCOSAL | Status: DC | PRN

## 2017-08-18 MED ORDER — ZOLPIDEM TARTRATE 5 MG PO TABS: 5.0000 mg | ORAL_TABLET | Freq: Every evening | ORAL | Status: DC | PRN

## 2017-08-18 MED ORDER — SODIUM CHLORIDE 0.9 % IJ SOLN
INTRAMUSCULAR | Status: DC | PRN
  Administered 2017-08-18: 20 mL

## 2017-08-18 MED ORDER — CEFAZOLIN SODIUM-DEXTROSE 2-4 GM/100ML-% IV SOLN
2.0000 g | INTRAVENOUS | Status: AC
  Administered 2017-08-18: 2 g via INTRAVENOUS

## 2017-08-18 MED ORDER — DEXAMETHASONE SODIUM PHOSPHATE 10 MG/ML IJ SOLN
10.0000 mg | Freq: Once | INTRAMUSCULAR | Status: AC
  Administered 2017-08-19: 10 mg via INTRAVENOUS
  Filled 2017-08-18: qty 1

## 2017-08-18 MED ORDER — BUPIVACAINE-EPINEPHRINE (PF) 0.5% -1:200000 IJ SOLN
INTRAMUSCULAR | Status: AC
  Filled 2017-08-18: qty 30

## 2017-08-18 MED ORDER — TRAMADOL HCL 50 MG PO TABS
50.0000 mg | ORAL_TABLET | Freq: Four times a day (QID) | ORAL | Status: DC
  Administered 2017-08-18 – 2017-08-20 (×9): 50 mg via ORAL
  Filled 2017-08-18 (×10): qty 1

## 2017-08-18 MED ORDER — PROPOFOL 10 MG/ML IV BOLUS
INTRAVENOUS | Status: DC | PRN
  Administered 2017-08-18 (×2): 10 mg via INTRAVENOUS
  Administered 2017-08-18: 40 mg via INTRAVENOUS

## 2017-08-18 MED ORDER — PHENYLEPHRINE HCL 10 MG/ML IJ SOLN
INTRAVENOUS | Status: DC | PRN
  Administered 2017-08-18: 30 ug/min via INTRAVENOUS

## 2017-08-18 MED ORDER — LACTATED RINGERS IV SOLN
INTRAVENOUS | Status: DC | PRN
  Administered 2017-08-18 (×2): via INTRAVENOUS

## 2017-08-18 MED ORDER — PANTOPRAZOLE SODIUM 40 MG PO TBEC
40.0000 mg | DELAYED_RELEASE_TABLET | Freq: Every day | ORAL | Status: DC
  Administered 2017-08-19 – 2017-08-20 (×2): 40 mg via ORAL
  Filled 2017-08-18 (×2): qty 1

## 2017-08-18 MED ORDER — METHOCARBAMOL 1000 MG/10ML IJ SOLN: 500.0000 mg | Freq: Four times a day (QID) | INTRAVENOUS | Status: DC | PRN

## 2017-08-18 MED ORDER — DIPHENHYDRAMINE HCL 12.5 MG/5ML PO ELIX: 12.5000 mg | ORAL_SOLUTION | ORAL | Status: DC | PRN

## 2017-08-18 MED ORDER — DEXAMETHASONE SODIUM PHOSPHATE 10 MG/ML IJ SOLN: INTRAMUSCULAR | Status: DC | PRN

## 2017-08-18 MED ORDER — OXYCODONE HCL 5 MG PO TABS
5.0000 mg | ORAL_TABLET | ORAL | Status: DC | PRN
  Administered 2017-08-18 – 2017-08-19 (×4): 10 mg via ORAL
  Filled 2017-08-18 (×5): qty 2

## 2017-08-18 MED ORDER — HYDROMORPHONE HCL 1 MG/ML IJ SOLN
0.5000 mg | INTRAMUSCULAR | Status: DC | PRN
  Administered 2017-08-18: 1 mg via INTRAVENOUS

## 2017-08-18 MED ORDER — PHENYLEPHRINE 40 MCG/ML (10ML) SYRINGE FOR IV PUSH (FOR BLOOD PRESSURE SUPPORT)
PREFILLED_SYRINGE | INTRAVENOUS | Status: AC
  Filled 2017-08-18: qty 10

## 2017-08-18 MED ORDER — HYDROMORPHONE HCL 1 MG/ML IJ SOLN
INTRAMUSCULAR | Status: AC
  Filled 2017-08-18: qty 1

## 2017-08-18 MED ORDER — BISACODYL 5 MG PO TBEC: 5.0000 mg | DELAYED_RELEASE_TABLET | Freq: Every day | ORAL | Status: DC | PRN

## 2017-08-18 MED ORDER — ONDANSETRON HCL 4 MG PO TABS: 4.0000 mg | ORAL_TABLET | Freq: Four times a day (QID) | ORAL | Status: DC | PRN

## 2017-08-18 SURGICAL SUPPLY — 68 items
ARTISURF 10M PLY R 6-9CD KNEE (Knees) ×2 IMPLANT
BANDAGE ACE 6X5 VEL STRL LF (GAUZE/BANDAGES/DRESSINGS) ×3 IMPLANT
BANDAGE ESMARK 6X9 LF (GAUZE/BANDAGES/DRESSINGS) ×1 IMPLANT
BLADE SAGITTAL 13X1.27X60 (BLADE) ×2 IMPLANT
BLADE SAGITTAL 13X1.27X60MM (BLADE) ×1
BLADE SAW SGTL 83.5X18.5 (BLADE) ×3 IMPLANT
BLADE SURG 10 STRL SS (BLADE) ×3 IMPLANT
BNDG CMPR 9X6 STRL LF SNTH (GAUZE/BANDAGES/DRESSINGS) ×1
BNDG ESMARK 6X9 LF (GAUZE/BANDAGES/DRESSINGS) ×3
BOWL SMART MIX CTS (DISPOSABLE) ×3 IMPLANT
BSPLAT TIB 5D D CMNT STM RT (Knees) ×1 IMPLANT
CEMENT BONE SIMPLEX SPEEDSET (Cement) ×6 IMPLANT
CLOSURE WOUND 1/2 X4 (GAUZE/BANDAGES/DRESSINGS) ×1
COVER SURGICAL LIGHT HANDLE (MISCELLANEOUS) ×3 IMPLANT
CUFF TOURNIQUET SINGLE 34IN LL (TOURNIQUET CUFF) ×3 IMPLANT
DRAPE EXTREMITY T 121X128X90 (DRAPE) ×3 IMPLANT
DRAPE HALF SHEET 40X57 (DRAPES) ×3 IMPLANT
DRAPE INCISE IOBAN 66X45 STRL (DRAPES) ×6 IMPLANT
DRAPE U-SHAPE 47X51 STRL (DRAPES) ×3 IMPLANT
DRSG AQUACEL AG ADV 3.5X10 (GAUZE/BANDAGES/DRESSINGS) ×3 IMPLANT
DURAPREP 26ML APPLICATOR (WOUND CARE) ×6 IMPLANT
ELECT REM PT RETURN 9FT ADLT (ELECTROSURGICAL) ×3
ELECTRODE REM PT RTRN 9FT ADLT (ELECTROSURGICAL) ×1 IMPLANT
FEMUR  CMT CCR STD SZ6 R KNEE (Knees) ×2 IMPLANT
FEMUR CMT CCR STD SZ6 R KNEE (Knees) ×1 IMPLANT
FEMUR CMTD CCR STD SZ6 R KNEE (Knees) IMPLANT
GLOVE BIOGEL M 7.0 STRL (GLOVE) ×6 IMPLANT
GLOVE BIOGEL PI IND STRL 7.5 (GLOVE) IMPLANT
GLOVE BIOGEL PI IND STRL 8.5 (GLOVE) ×1 IMPLANT
GLOVE BIOGEL PI INDICATOR 7.5 (GLOVE) ×2
GLOVE BIOGEL PI INDICATOR 8.5 (GLOVE) ×2
GLOVE SURG ORTHO 8.0 STRL STRW (GLOVE) ×6 IMPLANT
GOWN STRL REUS W/ TWL LRG LVL3 (GOWN DISPOSABLE) ×1 IMPLANT
GOWN STRL REUS W/ TWL XL LVL3 (GOWN DISPOSABLE) ×2 IMPLANT
GOWN STRL REUS W/TWL 2XL LVL3 (GOWN DISPOSABLE) ×3 IMPLANT
GOWN STRL REUS W/TWL LRG LVL3 (GOWN DISPOSABLE) ×3
GOWN STRL REUS W/TWL XL LVL3 (GOWN DISPOSABLE) ×6
HANDPIECE INTERPULSE COAX TIP (DISPOSABLE) ×3
HOOD PEEL AWAY FACE SHEILD DIS (HOOD) ×9 IMPLANT
KIT BASIN OR (CUSTOM PROCEDURE TRAY) ×3 IMPLANT
KIT ROOM TURNOVER OR (KITS) ×3 IMPLANT
MANIFOLD NEPTUNE II (INSTRUMENTS) ×3 IMPLANT
NDL 18GX1X1/2 (RX/OR ONLY) (NEEDLE) IMPLANT
NEEDLE 18GX1X1/2 (RX/OR ONLY) (NEEDLE) IMPLANT
NEEDLE 22X1 1/2 (OR ONLY) (NEEDLE) ×6 IMPLANT
NS IRRIG 1000ML POUR BTL (IV SOLUTION) ×3 IMPLANT
PACK TOTAL JOINT (CUSTOM PROCEDURE TRAY) ×3 IMPLANT
PAD ARMBOARD 7.5X6 YLW CONV (MISCELLANEOUS) ×6 IMPLANT
SET HNDPC FAN SPRY TIP SCT (DISPOSABLE) ×1 IMPLANT
STEM POLY PAT PLY 32M KNEE (Knees) ×2 IMPLANT
STEM TIBIA 5 DEG SZ D R KNEE (Knees) IMPLANT
STRIP CLOSURE SKIN 1/2X4 (GAUZE/BANDAGES/DRESSINGS) ×2 IMPLANT
SUCTION FRAZIER HANDLE 10FR (MISCELLANEOUS) ×2
SUCTION TUBE FRAZIER 10FR DISP (MISCELLANEOUS) IMPLANT
SUT BONE WAX W31G (SUTURE) ×3 IMPLANT
SUT MNCRL AB 3-0 PS2 18 (SUTURE) ×3 IMPLANT
SUT VIC AB 0 CTB1 27 (SUTURE) ×3 IMPLANT
SUT VIC AB 1 CT1 27 (SUTURE) ×6
SUT VIC AB 1 CT1 27XBRD ANBCTR (SUTURE) ×2 IMPLANT
SUT VIC AB 2-0 CT1 27 (SUTURE) ×9
SUT VIC AB 2-0 CT1 TAPERPNT 27 (SUTURE) ×2 IMPLANT
SUT VLOC 180 0 24IN GS25 (SUTURE) ×3 IMPLANT
SYR 20CC LL (SYRINGE) ×6 IMPLANT
TIBIA STEM 5 DEG SZ D R KNEE (Knees) ×3 IMPLANT
TOWEL OR 17X24 6PK STRL BLUE (TOWEL DISPOSABLE) ×3 IMPLANT
TOWEL OR 17X26 10 PK STRL BLUE (TOWEL DISPOSABLE) ×3 IMPLANT
TRAY CATH 16FR W/PLASTIC CATH (SET/KITS/TRAYS/PACK) ×2 IMPLANT
WRAP KNEE MAXI GEL POST OP (GAUZE/BANDAGES/DRESSINGS) ×3 IMPLANT

## 2017-08-18 NOTE — Plan of Care (Signed)
  Nutrition: Adequate nutrition will be maintained 08/18/2017 1548 - Progressing by Williams Che, RN   Elimination: Will not experience complications related to bowel motility 08/18/2017 1548 - Progressing by Williams Che, RN   Pain Managment: General experience of comfort will improve 08/18/2017 1548 - Progressing by Williams Che, RN   Safety: Ability to remain free from injury will improve 08/18/2017 1548 - Progressing by Williams Che, RN

## 2017-08-18 NOTE — Progress Notes (Signed)
Orthopedic Tech Progress Note Patient Details:  Breanna Montes [birth year] [id number]  CPM Right Knee CPM Right Knee: On Right Knee Flexion (Degrees): 90 Right Knee Extension (Degrees): 0 Additional Comments: foot roll  Post Interventions Patient Tolerated: Well Instructions Provided: Care of device, Adjustment of device  [name] 08/18/2017, 10:47 AM

## 2017-08-18 NOTE — H&P (Signed)
Breanna Montes MRN:  [id number] DOB/SEX:  08/15/1933/female  CHIEF COMPLAINT:  Painful right Knee  HISTORY: Patient is a 84 y.o. female presented with a history of pain in the right knee. Onset of symptoms was gradual starting a few years ago with gradually worsening course since that time. Patient has been treated conservatively with over-the-counter NSAIDs and activity modification. Patient currently rates pain in the knee at 10 out of 10 with activity. There is pain at night.  PAST MEDICAL HISTORY: There are no active problems to display for this patient.  Past Medical History:  Diagnosis Date  . Arthritis   . GERD (gastroesophageal reflux disease)   . History of hiatal hernia   . Osteoporosis   . PONV (postoperative nausea and vomiting)    Past Surgical History:  Procedure Laterality Date  . APPENDECTOMY    . BUNIONECTOMY     left    2005  . CATARACT EXTRACTION W/ INTRAOCULAR LENS  IMPLANT, BILATERAL     2009  . CERVICAL CONE BIOPSY    . cervical cryosurgery     90-95  . CHOLECYSTECTOMY     2001  . ERCP     2001  internal bleed  . FOOT SURGERY     rt  [phone]  . HEMORRHOID SURGERY     63+66  . HERNIA REPAIR     partial hip repair2017     right  . TUBAL LIGATION     73     MEDICATIONS:   Medications Prior to Admission  Medication Sig Dispense Refill Last Dose  . Calcium Carb-Cholecalciferol (CALCIUM 1000 + D PO) Take 1 tablet by mouth at bedtime.     . Cholecalciferol (MAXIMUM D3) 10000 units CAPS Take 10,000 Units by mouth at bedtime. VITAMIN D3     . cycloSPORINE (RESTASIS) 0.05 % ophthalmic emulsion Place 1 drop into both eyes 4 (four) times daily as needed (3-4 TIMES DAILY).     Marland Kitchen docusate sodium (COLACE) 100 MG capsule Take 100 mg by mouth every evening.     Marland Kitchen omeprazole (PRILOSEC) 20 MG capsule Take 20 mg by mouth daily before breakfast.     . OVER THE COUNTER MEDICATION Place 1 application into both eyes as needed (dry eyes). AVENOVA EYE WASH     .  polyethylene glycol powder (GLYCOLAX/MIRALAX) powder Take 17 g by mouth every evening.       ALLERGIES:   Allergies  Allergen Reactions  . Codeine Nausea And Vomiting  . Penicillins Rash    Has patient had a PCN reaction causing immediate rash, facial/tongue/throat swelling, SOB or lightheadedness with hypotension: Unknown Has patient had a PCN reaction causing severe rash involving mucus membranes or skin necrosis: Unknown Has patient had a PCN reaction that required hospitalization: No Has patient had a PCN reaction occurring within the last 10 years: No If all of the above answers are "NO", then may proceed with Cephalosporin use.     REVIEW OF SYSTEMS:  A comprehensive review of systems was negative except for: Musculoskeletal: positive for arthralgias and bone pain   FAMILY HISTORY:  History reviewed. No pertinent family history.  SOCIAL HISTORY:   Social History   Tobacco Use  . Smoking status: Former Smoker  Substance Use Topics  . Alcohol use: No    Frequency: Never     EXAMINATION:  Vital signs in last 24 hours:    There were no vitals taken for this visit.  General Appearance:  Alert, cooperative, no distress, appears stated age  Head:    Normocephalic, without obvious abnormality, atraumatic  Eyes:    PERRL, conjunctiva/corneas clear, EOM's intact, fundi    benign, both eyes  Ears:    Normal TM's and external ear canals, both ears  Nose:   Nares normal, septum midline, mucosa normal, no drainage    or sinus tenderness  Throat:   Lips, mucosa, and tongue normal; teeth and gums normal  Neck:   Supple, symmetrical, trachea midline, no adenopathy;    thyroid:  no enlargement/tenderness/nodules; no carotid   bruit or JVD  Back:     Symmetric, no curvature, ROM normal, no CVA tenderness  Lungs:     Clear to auscultation bilaterally, respirations unlabored  Chest Wall:    No tenderness or deformity   Heart:    Regular rate and rhythm, S1 and S2 normal, no  murmur, rub   or gallop  Breast Exam:    No tenderness, masses, or nipple abnormality  Abdomen:     Soft, non-tender, bowel sounds active all four quadrants,    no masses, no organomegaly  Genitalia:    Normal female without lesion, discharge or tenderness  Rectal:    Normal tone, no masses or tenderness;   guaiac negative stool  Extremities:   Extremities normal, atraumatic, no cyanosis or edema  Pulses:   2+ and symmetric all extremities  Skin:   Skin color, texture, turgor normal, no rashes or lesions  Lymph nodes:   Cervical, supraclavicular, and axillary nodes normal  Neurologic:   CNII-XII intact, normal strength, sensation and reflexes    throughout    Musculoskeletal:  ROM 0-120, Ligaments intact,  Imaging Review Plain radiographs demonstrate severe degenerative joint disease of the right knee. The overall alignment is neutral. The bone quality appears to be good for age and reported activity level.  Assessment/Plan: Primary osteoarthritis, right knee   The patient history, physical examination and imaging studies are consistent with advanced degenerative joint disease of the right knee. The patient has failed conservative treatment.  The clearance notes were reviewed.  After discussion with the patient it was felt that Total Knee Replacement was indicated. The procedure,  risks, and benefits of total knee arthroplasty were presented and reviewed. The risks including but not limited to aseptic loosening, infection, blood clots, vascular injury, stiffness, patella tracking problems complications among others were discussed. The patient acknowledged the explanation, agreed to proceed with the plan.  Donia Ast 08/18/2017, 6:18 AM

## 2017-08-18 NOTE — Transfer of Care (Signed)
Immediate Anesthesia Transfer of Care Note  Patient: Breanna Montes  Procedure(s) Performed: RIGHT TOTAL KNEE ARTHROPLASTY (Right Knee)  Patient Location: PACU  Anesthesia Type:Regional and Spinal  Level of Consciousness: awake, oriented and patient cooperative  Airway & Oxygen Therapy: Patient Spontanous Breathing and Patient connected to face mask oxygen  Post-op Assessment: Report given to RN and Post -op Vital signs reviewed and stable  Post vital signs: Reviewed and stable  Last Vitals:  Vitals:   08/18/17 0628 08/18/17 0700  BP: (!) 153/69 (!) 158/69  Pulse: 91   Resp: 20   Temp: 36.8 C   SpO2: 98% 100%    Last Pain:  Vitals:   08/18/17 0628  TempSrc: Oral         Complications: No apparent anesthesia complications

## 2017-08-18 NOTE — Anesthesia Postprocedure Evaluation (Signed)
Anesthesia Post Note  Patient: Breanna Montes  Procedure(s) Performed: RIGHT TOTAL KNEE ARTHROPLASTY (Right Knee)     Patient location during evaluation: PACU Anesthesia Type: Regional and Spinal Level of consciousness: oriented and awake and alert Pain management: pain level controlled Vital Signs Assessment: post-procedure vital signs reviewed and stable Respiratory status: spontaneous breathing, respiratory function stable and patient connected to nasal cannula oxygen Cardiovascular status: blood pressure returned to baseline and stable Postop Assessment: no headache, no backache and no apparent nausea or vomiting Anesthetic complications: no    Last Vitals:  Vitals:   08/18/17 1112 08/18/17 1128  BP: 128/76 132/70  Pulse: 93 85  Resp: 19 13  Temp:  36.6 C  SpO2: 94% 92%    Last Pain:  Vitals:   08/18/17 9449  TempSrc: Oral                 Barnet Glasgow

## 2017-08-18 NOTE — Anesthesia Procedure Notes (Signed)
Procedure Name: MAC Date/Time: 08/18/2017 8:30 AM Performed by: Orlie Dakin, CRNA Pre-anesthesia Checklist: Patient identified, Emergency Drugs available, Suction available, Patient being monitored and Timeout performed Patient Re-evaluated:Patient Re-evaluated prior to induction Oxygen Delivery Method: Simple face mask

## 2017-08-18 NOTE — Anesthesia Procedure Notes (Signed)
Anesthesia Regional Block: Adductor canal block   Pre-Anesthetic Checklist: ,, timeout performed, Correct Patient, Correct Site, Correct Laterality, Correct Procedure, Correct Position, site marked, Risks and benefits discussed,  Surgical consent,  Pre-op evaluation,  At surgeon's request and post-op pain management  Laterality: Right  Prep: chloraprep       Needles:  Injection technique: Single-shot  Needle Type: Echogenic Needle     Needle Length: 9cm  Needle Gauge: 21     Additional Needles:   Procedures:,,,, ultrasound used (permanent image in chart),,,,  Narrative:  Start time: 08/18/2017 7:14 AM End time: 08/18/2017 7:22 AM Injection made incrementally with aspirations every 5 mL.  Performed by: Personally  Anesthesiologist: Barnet Glasgow, MD

## 2017-08-18 NOTE — Anesthesia Procedure Notes (Addendum)
Spinal  Patient location during procedure: OB Start time: 08/18/2017 8:19 AM End time: 08/18/2017 8:27 AM Staffing Anesthesiologist: Barnet Glasgow, MD Performed: anesthesiologist  Preanesthetic Checklist Completed: patient identified, surgical consent, pre-op evaluation, timeout performed, IV checked, risks and benefits discussed and monitors and equipment checked Spinal Block Patient position: sitting Prep: Betadine and site prepped and draped Patient monitoring: heart rate, cardiac monitor, continuous pulse ox and blood pressure Approach: midline Location: L3-4 Injection technique: single-shot Needle Needle type: Pencan  Needle gauge: 24 G Needle length: 10 cm Assessment Sensory level: T4

## 2017-08-18 NOTE — Evaluation (Signed)
Physical Therapy Evaluation Patient Details Name: Breanna Montes MRN: [id number] DOB: 1933-10-08 Today's Date: 08/18/2017   History of Present Illness  Pt is an 84 y/o female s/p R TKA. PMH includes osteoporosis and R foot surgery.   Clinical Impression  Pt is s/p surgery above with deficits below. Pt limited in mobility tolerance secondary to nausea/vomiting. RN notified and gave nausea meds during session. Reviewed knee precautions, however, HEP deferred secondary to nausea/vomiting. Will continue to follow acutely to maximize functional mobility independence and safety.     Follow Up Recommendations Follow surgeon's recommendation for DC plan and follow-up therapies;Supervision for mobility/OOB    Equipment Recommendations  None recommended by PT    Recommendations for Other Services OT consult     Precautions / Restrictions Precautions Precautions: Knee Precaution Booklet Issued: Yes (comment) Precaution Comments: Reviewed knee precautions. Unable to review supine ther ex as pt with nausea/vomiting.  Restrictions Weight Bearing Restrictions: Yes RLE Weight Bearing: Weight bearing as tolerated      Mobility  Bed Mobility Overal bed mobility: Needs Assistance Bed Mobility: Supine to Sit;Sit to Supine     Supine to sit: Min assist Sit to supine: Min assist   General bed mobility comments: Min A for RLE management. Verbal cues for sequencing. Increased time required.   Transfers Overall transfer level: Needs assistance Equipment used: Rolling walker (2 wheeled) Transfers: Sit to/from [facility] Sit to Stand: Min assist Stand pivot transfers: Min assist       General transfer comment: Min A for lift assist and steadying. Verbal cues for safe hand placement. Min A for steadying throughout transfer to [facility] and back to bed. Pt with episode of nausea/vomiting, so further mobility deferred.   Ambulation/Gait             General Gait Details:  NT  Stairs            Wheelchair Mobility    Modified Rankin (Stroke Patients Only)       Balance Overall balance assessment: Needs assistance Sitting-balance support: No upper extremity supported;Feet supported Sitting balance-Leahy Scale: Good     Standing balance support: Bilateral upper extremity supported;During functional activity Standing balance-Leahy Scale: Poor Standing balance comment: Reliant on BUE Support                              Pertinent Vitals/Pain Pain Assessment: Faces Faces Pain Scale: Hurts whole lot Pain Location: R knee  Pain Descriptors / Indicators: Aching;Constant;Grimacing;Operative site guarding Pain Intervention(s): Limited activity within patient's tolerance;Monitored during session;Repositioned    Home Living Family/patient expects to be discharged to:: Unsure Living Arrangements: Alone Available Help at Discharge: (If daughter is able to get off will be able to provide 24/7) Type of Home: House Home Access: Level entry     Home Layout: One level Home Equipment: Walker - 2 wheels;Bedside commode      Prior Function Level of Independence: Independent               Hand Dominance        Extremity/Trunk Assessment   Upper Extremity Assessment Upper Extremity Assessment: Defer to OT evaluation    Lower Extremity Assessment Lower Extremity Assessment: RLE deficits/detail RLE Deficits / Details: Sensory in tact. Deficits consistent with post op pain and weakness.     Cervical / Trunk Assessment Cervical / Trunk Assessment: Normal  Communication   Communication: No difficulties  Cognition Arousal/Alertness: Awake/alert Behavior  During Therapy: WFL for tasks assessed/performed Overall Cognitive Status: Within Functional Limits for tasks assessed                                        General Comments      Exercises     Assessment/Plan    PT Assessment Patient needs continued  PT services  PT Problem List Decreased strength;Decreased range of motion;Decreased activity tolerance;Decreased balance;Decreased mobility;Decreased knowledge of use of DME;Decreased knowledge of precautions;Pain       PT Treatment Interventions DME instruction;Gait training;Stair training;Therapeutic activities;Therapeutic exercise;Functional mobility training;Balance training;Neuromuscular re-education;Patient/family education    PT Goals (Current goals can be found in the Care Plan section)  Acute Rehab PT Goals Patient Stated Goal: to feel better  PT Goal Formulation: With patient Time For Goal Achievement: 09/01/17 Potential to Achieve Goals: Good    Frequency 7X/week   Barriers to discharge        Co-evaluation               AM-PAC PT "6 Clicks" Daily Activity  Outcome Measure Difficulty turning over in bed (including adjusting bedclothes, sheets and blankets)?: A Little Difficulty moving from lying on back to sitting on the side of the bed? : Unable Difficulty sitting down on and standing up from a chair with arms (e.g., wheelchair, bedside commode, etc,.)?: Unable Help needed moving to and from a bed to chair (including a wheelchair)?: A Little Help needed walking in hospital room?: A Little Help needed climbing 3-5 steps with a railing? : A Lot 6 Click Score: 13    End of Session Equipment Utilized During Treatment: Gait belt Activity Tolerance: Treatment limited secondary to medical complications (Comment)(nausea/vomiting ) Patient left: in bed;with call bell/phone within reach Nurse Communication: Mobility status;Other (comment)(nausea/vomiting ) PT Visit Diagnosis: Unsteadiness on feet (R26.81);Other abnormalities of gait and mobility (R26.89);Pain Pain - Right/Left: Right Pain - part of body: Knee    Time: 7169-6789 PT Time Calculation (min) (ACUTE ONLY): 27 min   Charges:   PT Evaluation $PT Eval Moderate Complexity: 1 Mod PT  Treatments $Therapeutic Activity: 8-22 mins   PT G Codes:        Leighton Ruff, PT, DPT  Acute Rehabilitation Services  Pager: [phone]   Rudean Hitt 08/18/2017, 4:49 PM

## 2017-08-19 ENCOUNTER — Encounter (HOSPITAL_COMMUNITY): Payer: Self-pay | Admitting: Orthopedic Surgery

## 2017-08-19 DIAGNOSIS — M1711 Unilateral primary osteoarthritis, right knee: Secondary | ICD-10-CM | POA: Diagnosis present

## 2017-08-19 DIAGNOSIS — Z79899 Other long term (current) drug therapy: Secondary | ICD-10-CM | POA: Diagnosis not present

## 2017-08-19 DIAGNOSIS — M25761 Osteophyte, right knee: Secondary | ICD-10-CM | POA: Diagnosis present

## 2017-08-19 DIAGNOSIS — Z880 Allergy status to penicillin: Secondary | ICD-10-CM | POA: Diagnosis not present

## 2017-08-19 DIAGNOSIS — Z9049 Acquired absence of other specified parts of digestive tract: Secondary | ICD-10-CM | POA: Diagnosis not present

## 2017-08-19 DIAGNOSIS — K219 Gastro-esophageal reflux disease without esophagitis: Secondary | ICD-10-CM | POA: Diagnosis present

## 2017-08-19 DIAGNOSIS — M810 Age-related osteoporosis without current pathological fracture: Secondary | ICD-10-CM | POA: Diagnosis present

## 2017-08-19 DIAGNOSIS — M25561 Pain in right knee: Secondary | ICD-10-CM | POA: Diagnosis present

## 2017-08-19 DIAGNOSIS — Z961 Presence of intraocular lens: Secondary | ICD-10-CM | POA: Diagnosis present

## 2017-08-19 DIAGNOSIS — Z885 Allergy status to narcotic agent status: Secondary | ICD-10-CM | POA: Diagnosis not present

## 2017-08-19 DIAGNOSIS — Z87891 Personal history of nicotine dependence: Secondary | ICD-10-CM | POA: Diagnosis not present

## 2017-08-19 LAB — CBC
HEMATOCRIT: 35.3 % — AB (ref 36.0–46.0)
HEMOGLOBIN: 12 g/dL (ref 12.0–15.0)
MCH: 31.9 pg (ref 26.0–34.0)
MCHC: 34 g/dL (ref 30.0–36.0)
MCV: 93.9 fL (ref 78.0–100.0)
Platelets: 248 10*3/uL (ref 150–400)
RBC: 3.76 MIL/uL — ABNORMAL LOW (ref 3.87–5.11)
RDW: 12.8 % (ref 11.5–15.5)
WBC: 10 10*3/uL (ref 4.0–10.5)

## 2017-08-19 LAB — BASIC METABOLIC PANEL
Anion gap: 9 (ref 5–15)
BUN: 15 mg/dL (ref 6–20)
CALCIUM: 8.8 mg/dL — AB (ref 8.9–10.3)
CHLORIDE: 103 mmol/L (ref 101–111)
CO2: 25 mmol/L (ref 22–32)
Creatinine, Ser: 0.76 mg/dL (ref 0.44–1.00)
GFR calc non Af Amer: >60 mL/min (ref >60)
GLUCOSE: 114 mg/dL — AB (ref 65–99)
Potassium: 4.3 mmol/L (ref 3.5–5.1)
Sodium: 137 mmol/L (ref 135–145)

## 2017-08-19 LAB — GLUCOSE, CAPILLARY: Glucose-Capillary: 106 mg/dL — ABNORMAL HIGH (ref 65–99)

## 2017-08-19 MED ORDER — METHOCARBAMOL 500 MG PO TABS: 500.0000 mg | ORAL_TABLET | Freq: Four times a day (QID) | ORAL | 0 refills | Status: DC | PRN

## 2017-08-19 MED ORDER — OXYCODONE HCL 10 MG PO TABS: 10.0000 mg | ORAL_TABLET | ORAL | 0 refills | Status: DC | PRN

## 2017-08-19 MED ORDER — ASPIRIN 325 MG PO TBEC: 325.0000 mg | DELAYED_RELEASE_TABLET | Freq: Two times a day (BID) | ORAL | 0 refills | Status: DC

## 2017-08-19 NOTE — Clinical Social Work Note (Signed)
Clinical Social Work Assessment  Patient Details  Name: Breanna Montes MRN: [id number] Date of Birth: [birth year]  Date of referral:  08/19/17               Reason for consult:  Facility Placement                Permission sought to share information with:    Permission granted to share information::  No  Name::        Agency::     Relationship::     Contact Information:     Housing/Transportation Living arrangements for the past 2 months:  Single Family Home Source of Information:  Patient Patient Interpreter Needed:  None Criminal Activity/Legal Involvement Pertinent to Current Situation/Hospitalization:  No - Comment as needed Significant Relationships:  Adult Children, Friend Lives with:  Self Do you feel safe going back to the place where you live?  Yes Need for family participation in patient care:  No (Coment)  Care giving concerns:  Pt from home alone and initially was considering SNF given Right Knee impairment. CSW met with patient at bedside and she indicated that she will go home with a friend tomorrow and then her daughter will be coming in from out of town to care for her. She indicated that she worked with therapy and felt that she would be good going home. At this time, patient declines SNF.  CSW explained the SNF process/placement and the Insurance Auth process. Pt voiced understanding and still decides to go home. CSW will advise [facility] as they were preparing for patient since she prearranged.  Social Worker assessment / plan:  CSW will defer to [facility] to assist with home health needs.  Employment status:  Retired Nurse, adult PT Recommendations:  Home with [place] / Referral to community resources:  Other (Comment Required)(None)  Patient/Family's Response to care:  Patient thanked CSW for f/u and discussing disposition. Pt stated she is going home with friends and family.  Patient/Family's Understanding of and  Emotional Response to Diagnosis, Current Treatment, and Prognosis:  Patient declined to go to SNF and indicated that she can be cared for at home with friends and family. CSW discussed the SNF process and reiterated her desires to return home. CSW signing off for now. No issues or concerns identified.  Emotional Assessment Appearance:  Appears stated age Attitude/Demeanor/Rapport:  (Cooperative) Affect (typically observed):  Accepting, Appropriate Orientation:  Oriented to Situation, Oriented to  Time, Oriented to Place, Oriented to Self Alcohol / Substance use:  Not Applicable Psych involvement (Current and /or in the community):  No (Comment)  Discharge Needs  Concerns to be addressed:  Discharge Planning Concerns Readmission within the last 30 days:  No Current discharge risk:  Dependent with Mobility, Lives alone Barriers to Discharge:  No Barriers Identified   Normajean Baxter, LCSW 08/19/2017, 12:59 PM

## 2017-08-19 NOTE — Progress Notes (Signed)
Physical Therapy Treatment Patient Details Name: Breanna Montes MRN: [id number] DOB: 06-Oct-1933 Today's Date: 08/19/2017    History of Present Illness Pt is an 84 y/o female s/p R TKA. PMH includes osteoporosis and R foot surgery.     PT Comments    Patient is progressing well toward mobility goals. Pt required supervision/min guard for mobility and tolerated ambulating 138ft. Review HEP next session.    Follow Up Recommendations  Follow surgeon's recommendation for DC plan and follow-up therapies;Supervision for mobility/OOB     Equipment Recommendations  None recommended by PT    Recommendations for Other Services OT consult     Precautions / Restrictions Precautions Precautions: Knee Precaution Comments: knee precautions reviewed with pt Restrictions Weight Bearing Restrictions: Yes RLE Weight Bearing: Weight bearing as tolerated    Mobility  Bed Mobility Overal bed mobility: Needs Assistance Bed Mobility: Supine to Sit     Supine to sit: Supervision     General bed mobility comments: supervision for safety; increased time and effort   Transfers Overall transfer level: Needs assistance Equipment used: Rolling walker (2 wheeled) Transfers: Sit to/from Stand Sit to Stand: Min guard         General transfer comment: min guard for safety; cues for safe hand placement  Ambulation/Gait Ambulation/Gait assistance: Supervision;Min guard Ambulation Distance (Feet): 180 Feet Assistive device: Rolling walker (2 wheeled) Gait Pattern/deviations: Step-to pattern;Step-through pattern;Decreased stance time - right;Decreased step length - left     General Gait Details: cues for sequencing, posture, safe proximity to RW, and R heel strike   Stairs            Wheelchair Mobility    Modified Rankin (Stroke Patients Only)       Balance Overall balance assessment: Needs assistance Sitting-balance support: No upper extremity supported;Feet supported Sitting  balance-Leahy Scale: Good     Standing balance support: Bilateral upper extremity supported;During functional activity Standing balance-Leahy Scale: Poor Standing balance comment: Reliant on BUE Support                             Cognition Arousal/Alertness: Awake/alert Behavior During Therapy: WFL for tasks assessed/performed Overall Cognitive Status: Within Functional Limits for tasks assessed                                        Exercises      General Comments General comments (skin integrity, edema, etc.): pt's friend present during session      Pertinent Vitals/Pain Pain Assessment: 0-10 Pain Score: 3  Pain Location: R knee  Pain Descriptors / Indicators: Grimacing;Guarding;Sore Pain Intervention(s): Limited activity within patient's tolerance;Monitored during session;Premedicated before session;Repositioned    Home Living                      Prior Function            PT Goals (current goals can now be found in the care plan section) Acute Rehab PT Goals Patient Stated Goal: to feel better  PT Goal Formulation: With patient Time For Goal Achievement: 09/01/17 Potential to Achieve Goals: Good Progress towards PT goals: Progressing toward goals    Frequency    7X/week      PT Plan Current plan remains appropriate    Co-evaluation  AM-PAC PT "6 Clicks" Daily Activity  Outcome Measure  Difficulty turning over in bed (including adjusting bedclothes, sheets and blankets)?: A Little Difficulty moving from lying on back to sitting on the side of the bed? : A Lot Difficulty sitting down on and standing up from a chair with arms (e.g., wheelchair, bedside commode, etc,.)?: Unable Help needed moving to and from a bed to chair (including a wheelchair)?: A Little Help needed walking in hospital room?: A Little Help needed climbing 3-5 steps with a railing? : A Little 6 Click Score: 15    End of  Session Equipment Utilized During Treatment: Gait belt Activity Tolerance: Patient tolerated treatment well Patient left: with call bell/phone within reach;in chair;with family/visitor present Nurse Communication: Mobility status PT Visit Diagnosis: Unsteadiness on feet (R26.81);Other abnormalities of gait and mobility (R26.89);Pain Pain - Right/Left: Right Pain - part of body: Knee     Time: 1110-1134 PT Time Calculation (min) (ACUTE ONLY): 24 min  Charges:  $Gait Training: 23-37 mins                    G Codes:       Earney Navy, PTA Pager: [phone]     Darliss Cheney 08/19/2017, 1:07 PM

## 2017-08-19 NOTE — Progress Notes (Signed)
Physical Therapy Treatment Patient Details Name: Breanna Montes MRN: [id number] DOB: [birth year] Today's Date: 08/19/2017    History of Present Illness Pt is an 84 y/o female s/p R TKA. PMH includes osteoporosis and R foot surgery.     PT Comments    Patient continues to make progress toward PT goals. Continue to progress as tolerated.    Follow Up Recommendations  Follow surgeon's recommendation for DC plan and follow-up therapies;Supervision for mobility/OOB     Equipment Recommendations  None recommended by PT    Recommendations for Other Services OT consult     Precautions / Restrictions Precautions Precautions: Knee Precaution Comments: knee precautions reviewed with pt Restrictions Weight Bearing Restrictions: Yes RLE Weight Bearing: Weight bearing as tolerated    Mobility  Bed Mobility Overal bed mobility: Needs Assistance Bed Mobility: Sit to Supine     Supine to sit: Supervision Sit to supine: Min guard   General bed mobility comments: min guard for safety; no physical assistance needed  Transfers Overall transfer level: Needs assistance Equipment used: Rolling walker (2 wheeled) Transfers: Sit to/from Stand Sit to Stand: Min guard;Supervision         General transfer comment: min guard for safety from recliner and supervision from [facility]; cues for safe hand placement  Ambulation/Gait Ambulation/Gait assistance: Min guard Ambulation Distance (Feet): 24 Feet Assistive device: Rolling walker (2 wheeled) Gait Pattern/deviations: Step-through pattern;Decreased stance time - right;Decreased step length - left;Decreased weight shift to right;Antalgic Gait velocity: decreased   General Gait Details: cues for posture, sequencing, and increased L step length   Stairs            Wheelchair Mobility    Modified Rankin (Stroke Patients Only)       Balance Overall balance assessment: Needs assistance Sitting-balance support: No upper extremity  supported;Feet supported Sitting balance-Leahy Scale: Good     Standing balance support: Bilateral upper extremity supported;During functional activity Standing balance-Leahy Scale: Poor Standing balance comment: Reliant on BUE Support                             Cognition Arousal/Alertness: Awake/alert Behavior During Therapy: WFL for tasks assessed/performed Overall Cognitive Status: Within Functional Limits for tasks assessed                                        Exercises Total Joint Exercises Ankle Circles/Pumps: AROM;Both;15 reps Quad Sets: AROM;Right;10 reps Short Arc Quad: AROM;Right;10 reps Heel Slides: AAROM;AROM;Right;10 reps Hip ABduction/ADduction: AROM;AAROM;Right;10 reps Straight Leg Raises: AROM;Right;5 reps    General Comments General comments (skin integrity, edema, etc.): pt's friend present during session      Pertinent Vitals/Pain Pain Assessment: Faces Pain Score: 3  Faces Pain Scale: Hurts little more Pain Location: R knee  Pain Descriptors / Indicators: Grimacing;Guarding;Sore Pain Intervention(s): Limited activity within patient's tolerance;Monitored during session;Premedicated before session;Repositioned    Home Living                      Prior Function            PT Goals (current goals can now be found in the care plan section) Acute Rehab PT Goals Patient Stated Goal: to feel better  PT Goal Formulation: With patient Time For Goal Achievement: 09/01/17 Potential to Achieve Goals: Good Progress towards PT goals: Progressing toward goals  Frequency    7X/week      PT Plan Current plan remains appropriate    Co-evaluation              AM-PAC PT "6 Clicks" Daily Activity  Outcome Measure  Difficulty turning over in bed (including adjusting bedclothes, sheets and blankets)?: A Little Difficulty moving from lying on back to sitting on the side of the bed? : A Lot Difficulty sitting  down on and standing up from a chair with arms (e.g., wheelchair, bedside commode, etc,.)?: Unable Help needed moving to and from a bed to chair (including a wheelchair)?: A Little Help needed walking in hospital room?: A Little Help needed climbing 3-5 steps with a railing? : A Little 6 Click Score: 15    End of Session Equipment Utilized During Treatment: Gait belt Activity Tolerance: Patient tolerated treatment well Patient left: with call bell/phone within reach;in bed Nurse Communication: Mobility status PT Visit Diagnosis: Unsteadiness on feet (R26.81);Other abnormalities of gait and mobility (R26.89);Pain Pain - Right/Left: Right Pain - part of body: Knee     Time: 8453-6468 PT Time Calculation (min) (ACUTE ONLY): 34 min  Charges:  $Gait Training: 8-22 mins $Therapeutic Exercise: 8-22 mins                    G Codes:       Earney Navy, PTA Pager: [phone]     Darliss Cheney 08/19/2017, 3:04 PM

## 2017-08-19 NOTE — Care Management Note (Addendum)
Case Management Note  Patient Details  Name: Breanna Montes MRN: [id number] Date of Birth: November 25, 1933  Subjective/Objective:  83 yr old female s/p right total knee arthroplasty.                  Action/Plan: Case manager spoke with patient concerning discharge plan and DME. Patient was preoperatively set to go to [facility]. She has decided that she will return home with assistance from a friend that will stay with her until her daughter comes from [place] this weekend. Case manager spoke with Lattie Haw at Dr. Ruel Favors office with update on discharge plan. Case manager contacted Bernette Mayers, [facility] to inform her of referral.  Case manager notified Ovid Curd with [employer] of change in discharge plan, he will deliver CPM and 3in1 to patient's home.      Expected Discharge Date:   08/20/17               Expected Discharge Plan:  [place]  In-House Referral:  NA  Discharge planning Services  CM Consult  Post Acute Care Choice:  Durable Medical Equipment, Home Health Choice offered to:  Patient  DME Arranged:  3-N-1, CPM(has RW) DME Agency:  [employer]  HH Arranged:  PT HH Agency:  [facility]  Status of Service:  Completed, signed off  If discussed at [employer], dates discussed:    Additional Comments:  Ninfa Meeker, RN 08/19/2017, 3:37 PM

## 2017-08-19 NOTE — Evaluation (Signed)
Occupational Therapy Evaluation Patient Details Name: Breanna Montes MRN: [id number] DOB: 1933/10/25 Today's Date: 08/19/2017    History of Present Illness Pt is an 84 y/o female s/p R TKA. PMH includes osteoporosis and R foot surgery.    Clinical Impression   This 84 y/o F presents with the above. Pt lives alone, at baseline is independent with ADLs and functional mobility. Pt completing functional mobility within room at RW level this session with overall MinGuard assist; currently requires ModA for LB ADLs secondary to RLE functional limitations. Pt reports will initially return home with assist from a friend until daughter is able to stay/provide assist. Pt will benefit from continued acute OT services to maximize her overall safety and independence with ADLs and mobility prior to return home.     Follow Up Recommendations  Follow surgeon's recommendation for DC plan and follow-up therapies;Supervision/Assistance - 24 hour    Equipment Recommendations  3 in 1 bedside commode           Precautions / Restrictions Precautions Precautions: Knee Precaution Comments: knee precautions reviewed with pt Restrictions Weight Bearing Restrictions: Yes RLE Weight Bearing: Weight bearing as tolerated      Mobility Bed Mobility Overal bed mobility: Needs Assistance Bed Mobility: Supine to Sit;Sit to Supine     Supine to sit: Min assist Sit to supine: Min guard   General bed mobility comments: assist for RLE over EOB and to guide to floor; minguard for safety when returning to supine with no physical assist needed   Transfers Overall transfer level: Needs assistance Equipment used: Rolling walker (2 wheeled) Transfers: Sit to/from Stand Sit to Stand: Min guard         General transfer comment: MinGuard from EOB with Pt demonstrating safe hand placement     Balance Overall balance assessment: Needs assistance Sitting-balance support: No upper extremity supported;Feet  supported Sitting balance-Leahy Scale: Good     Standing balance support: Bilateral upper extremity supported;During functional activity Standing balance-Leahy Scale: Poor Standing balance comment: Reliant on BUE Support at this time                            ADL either performed or assessed with clinical judgement   ADL Overall ADL's : Needs assistance/impaired Eating/Feeding: Modified independent;Sitting   Grooming: Set up;Sitting   Upper Body Bathing: Min guard;Sitting   Lower Body Bathing: Minimal assistance;Sit to/from stand   Upper Body Dressing : Min guard;Sitting   Lower Body Dressing: Moderate assistance;Sit to/from stand Lower Body Dressing Details (indicate cue type and reason): pt reports familiarity with use of AE for completing ADLS including sock aide and reacher  Toilet Transfer: Min guard;Ambulation;RW;Regular Glass blower/designer Details (indicate cue type and reason): simulated in transfer to/from EOB  Toileting- Clothing Manipulation and Hygiene: Minimal assistance;Sit to/from stand       Functional mobility during ADLs: Min guard;Rolling walker General ADL Comments: pt with increased pain in RLE having recently completed PT session but is agreeable to EOB activity and brief room level functional mobility; pt ambulating around EOB to other side using RW with overall MinGuard; educated on compensatory techniques for completing ADLs                          Pertinent Vitals/Pain Pain Assessment: Faces  Faces Pain Scale: Hurts whole lot Pain Location: R knee  Pain Descriptors / Indicators: Grimacing;Guarding;Sore Pain Intervention(s): Monitored during session;Patient  requesting pain meds-RN notified;Repositioned;Limited activity within patient's tolerance          Extremity/Trunk Assessment Upper Extremity Assessment Upper Extremity Assessment: Overall WFL for tasks assessed   Lower Extremity Assessment Lower Extremity  Assessment: Defer to PT evaluation   Cervical / Trunk Assessment Cervical / Trunk Assessment: Normal   Communication Communication Communication: No difficulties   Cognition Arousal/Alertness: Awake/alert Behavior During Therapy: WFL for tasks assessed/performed Overall Cognitive Status: Within Functional Limits for tasks assessed                                                    Home Living Family/patient expects to be discharged to:: Private residence Living Arrangements: Alone Available Help at Discharge: Family;Friend(s)(friend will be staying initially until daughter is able to stay with pt ) Type of Home: House Home Access: Level entry     Home Layout: One level     Bathroom Shower/Tub: Walk-in shower;Tub/shower unit   Bathroom Toilet: Standard     Home Equipment: Environmental consultant - 2 wheels;Shower seat - built Hotel manager: Reacher;Sock aid        Prior Functioning/Environment Level of Independence: Independent                 OT Problem List: Decreased strength;Decreased range of motion;Impaired balance (sitting and/or standing);Decreased knowledge of use of DME or AE;Decreased activity tolerance      OT Treatment/Interventions: Self-care/ADL training;DME and/or AE instruction;Therapeutic activities;Balance training;Therapeutic exercise;Patient/family education    OT Goals(Current goals can be found in the care plan section) Acute Rehab OT Goals Patient Stated Goal: to feel better  OT Goal Formulation: With patient Time For Goal Achievement: 09/02/17 Potential to Achieve Goals: Good  OT Frequency: Min 2X/week                             AM-PAC PT "6 Clicks" Daily Activity     Outcome Measure Help from another person eating meals?: None Help from another person taking care of personal grooming?: A Little Help from another person toileting, which includes using toliet, bedpan, or urinal?: A  Little Help from another person bathing (including washing, rinsing, drying)?: A Lot Help from another person to put on and taking off regular upper body clothing?: None Help from another person to put on and taking off regular lower body clothing?: A Lot 6 Click Score: 18   End of Session Equipment Utilized During Treatment: Rolling walker Nurse Communication: Mobility status;Patient requests pain meds  Activity Tolerance: Patient tolerated treatment well;Patient limited by pain Patient left: in bed;with call bell/phone within reach  OT Visit Diagnosis: Other abnormalities of gait and mobility (R26.89);Pain Pain - Right/Left: Right Pain - part of body: Knee                Time: 1442-1500 OT Time Calculation (min): 18 min Charges:  OT General Charges $OT Visit: 1 Visit OT Evaluation $OT Eval Low Complexity: 1 Low G-Codes:     Lou Cal, OT Pager [phone] 08/19/2017   Raymondo Band 08/19/2017, 3:39 PM

## 2017-08-19 NOTE — Progress Notes (Signed)
SPORTS MEDICINE AND JOINT REPLACEMENT  Lara Mulch, MD    Carlyon Shadow, PA-C [address], [place], [place]  [address]                             [phone]   PROGRESS NOTE  Subjective:  negative for Chest Pain  negative for Shortness of Breath  negative for Nausea/Vomiting   negative for Calf Pain  negative for Bowel Movement   Tolerating Diet: yes         Patient reports pain as 4 on 0-10 scale.    Objective: Vital signs in last 24 hours:    Patient Vitals for the past 24 hrs:  BP Temp Temp src Pulse Resp SpO2  08/19/17 0424 132/65 98.5 F (36.9 C) Oral 85 16 96 %  08/18/17 2345 130/60 98.7 F (37.1 C) Oral 78 16 95 %  08/18/17 1936 134/60 97.6 F (36.4 C) Oral 78 16 97 %  08/18/17 1500 (!) 135/97 (!) 95.5 F (35.3 C) - 81 - 95 %  08/18/17 1351 - 98.3 F (36.8 C) - 84 16 94 %  08/18/17 1330 (!) 143/72 - - 88 10 95 %  08/18/17 1300 (!) 145/90 - - 91 16 96 %  08/18/17 1230 134/67 - - 91 13 96 %  08/18/17 1200 114/77 - - 95 12 95 %  08/18/17 1128 132/70 97.8 F (36.6 C) - 85 13 92 %  08/18/17 1112 128/76 - - 93 19 94 %  08/18/17 1057 139/74 - - 85 14 97 %  08/18/17 1042 (!) 142/73 - - 80 11 95 %  08/18/17 1027 (!) 151/78 97.7 F (36.5 C) - 82 13 100 %    @flow {1959:LAST@   Intake/Output from previous day:   03/04 0701 - 03/05 0700 In: 1640 [P.O.:240; I.V.:1400] Out: 1600 [Urine:1600]   Intake/Output this shift:   No intake/output data recorded.   Intake/Output      03/04 0701 - 03/05 0700 03/05 0701 - 03/06 0700   P.O. 240    I.V. 1400    Total Intake 1640    Urine 1600    Emesis/NG output 0    Total Output 1600    Net +40         Emesis Occurrence 1 x       LABORATORY DATA: Recent Labs    08/19/17 0546  WBC 10.0  HGB 12.0  HCT 35.3*  PLT 248   Recent Labs    08/19/17 0546  NA 137  K 4.3  CL 103  CO2 25  BUN 15  CREATININE 0.76  GLUCOSE 114*  CALCIUM 8.8*   No results found for: INR,  PROTIME  Examination:  General appearance: alert, cooperative and no distress Extremities: extremities normal, atraumatic, no cyanosis or edema  Wound Exam: clean, dry, intact   Drainage:  None: wound tissue dry  Motor Exam: Quadriceps and Hamstrings Intact  Sensory Exam: Superficial Peroneal, Deep Peroneal and Tibial normal   Assessment:    1 Day Post-Op  Procedure(s) (LRB): RIGHT TOTAL KNEE ARTHROPLASTY (Right)  ADDITIONAL DIAGNOSIS:  Active Problems:   S/P total knee replacement     Plan: Physical Therapy as ordered Weight Bearing as Tolerated (WBAT)  DVT Prophylaxis:  Aspirin  DISCHARGE PLAN: Home vs SNF  DISCHARGE NEEDS: HHPT   Patient doing well. Will continue to monitor progress to determine best D/C, home vs SNF  Donia Ast 08/19/2017, 7:02 AM

## 2017-08-20 LAB — CBC
HCT: 36.2 % (ref 36.0–46.0)
Hemoglobin: 12.2 g/dL (ref 12.0–15.0)
MCH: 31 pg (ref 26.0–34.0)
MCHC: 33.7 g/dL (ref 30.0–36.0)
MCV: 92.1 fL (ref 78.0–100.0)
Platelets: 210 10*3/uL (ref 150–400)
RBC: 3.93 MIL/uL (ref 3.87–5.11)
RDW: 12.5 % (ref 11.5–15.5)
WBC: 12.2 10*3/uL — ABNORMAL HIGH (ref 4.0–10.5)

## 2017-08-20 NOTE — Progress Notes (Signed)
Occupational Therapy Treatment Patient Details Name: Breanna Montes MRN: [id number] DOB: March 15, 1934 Today's Date: 08/20/2017    History of present illness Pt is an 84 y/o female s/p R TKA. PMH includes osteoporosis and R foot surgery.    OT comments  Pt participated in ADL retraining session with focus on shower transfer using 3:1, grooming standing at sink and toileting all aspects this morning. She plans to have a friend pick her up when she is discharged later today and her daughter will stay with her through the weekend per pt report. She is currently supervision-Min assist for LB ADL's.   Follow Up Recommendations  Follow surgeon's recommendation for DC plan and follow-up therapies;Supervision/Assistance - 24 hour    Equipment Recommendations  3 in 1 bedside commode    Recommendations for Other Services      Precautions / Restrictions Precautions Precautions: Knee Precaution Comments: knee precautions reviewed with pt Restrictions Weight Bearing Restrictions: Yes RLE Weight Bearing: Weight bearing as tolerated       Mobility Bed Mobility Overal bed mobility: Modified Independent Bed Mobility: Supine to Sit     Supine to sit: Modified independent (Device/Increase time) Sit to supine: Modified independent (Device/Increase time)   General bed mobility comments: increased time and effort  Transfers Overall transfer level: Needs assistance Equipment used: Rolling walker (2 wheeled) Transfers: Sit to/from [facility] Sit to Stand: Supervision Stand pivot transfers: Min guard       General transfer comment: min guard for safety; safe hand placement demonstrated    Balance Overall balance assessment: Needs assistance Sitting-balance support: No upper extremity supported;Feet supported Sitting balance-Leahy Scale: Good     Standing balance support: Bilateral upper extremity supported;During functional activity Standing balance-Leahy Scale:  Poor Standing balance comment: Reliant on BUE Support at this time                            ADL either performed or assessed with clinical judgement   ADL Overall ADL's : Needs assistance/impaired Eating/Feeding: Modified independent;Sitting   Grooming: Wash/dry hands;Wash/dry face;Oral care;Standing;Supervision/safety                   Toilet Transfer: Supervision/safety;Min guard;Ambulation;RW;BSC   Toileting- Clothing Manipulation and Hygiene: Supervision/safety;Min guard;Sitting/lateral lean;Sit to/from stand   Tub/ Shower Transfer: Walk-in shower;Min guard;3 in 1;Ambulation;Rolling walker;Cueing for sequencing(Simulated shower transfer using 3:1 in bath room)     General ADL Comments: Pt participated in ADL retraining session with focus on shower transfer using 3:1, also discussed washing up at sink when intially d/c home & having assistance with shower PRN, grooming standing at sink and toileting all aspects this morning. She plans to have a friend pick her up when she is discharged later today and her daughter will stay with her through the weekend per pt report. She is currently supervision-Min guard assist.     Vision Patient Visual Report: No change from baseline     Perception     Praxis      Cognition Arousal/Alertness: Awake/alert Behavior During Therapy: WFL for tasks assessed/performed Overall Cognitive Status: Within Functional Limits for tasks assessed                                          Exercises Exercises: Total Joint Total Joint Exercises Long Arc Quad: AROM;Right;10 reps;Seated Knee Flexion: AROM;Right;5 reps;Seated;Other (  comment)(10 sec holds)   Shoulder Instructions       General Comments      Pertinent Vitals/ Pain       Pain Assessment: Faces Faces Pain Scale: Hurts a little bit Pain Location: R knee  Pain Descriptors / Indicators: Grimacing;Guarding;Sore Pain Intervention(s): Limited activity  within patient's tolerance;Repositioned;Monitored during session;Premedicated before session  Home Living                                          Prior Functioning/Environment              Frequency  Min 2X/week        Progress Toward Goals  OT Goals(current goals can now be found in the care plan section)  Progress towards OT goals: Progressing toward goals  Acute Rehab OT Goals Patient Stated Goal: Decreased pain, go home  Plan Discharge plan remains appropriate    Co-evaluation                 AM-PAC PT "6 Clicks" Daily Activity     Outcome Measure   Help from another person eating meals?: None Help from another person taking care of personal grooming?: A Little Help from another person toileting, which includes using toliet, bedpan, or urinal?: A Little Help from another person bathing (including washing, rinsing, drying)?: A Lot Help from another person to put on and taking off regular upper body clothing?: None Help from another person to put on and taking off regular lower body clothing?: A Lot 6 Click Score: 18    End of Session Equipment Utilized During Treatment: Rolling walker CPM Right Knee CPM Right Knee: Off  OT Visit Diagnosis: Other abnormalities of gait and mobility (R26.89);Pain Pain - Right/Left: Right Pain - part of body: Knee   Activity Tolerance Patient tolerated treatment well   Patient Left in bed;with call bell/phone within reach;Other (comment)   Nurse Communication Mobility status;Other (comment)(Pt requested Choc Ensure)        Time: 9476-5465 OT Time Calculation (min): 21 min  Charges: OT General Charges $OT Visit: 1 Visit OT Treatments $Self Care/Home Management : 8-22 mins    Lanesha Azzaro Beth Dixon, OTR/L 08/20/2017, 11:10 AM

## 2017-08-20 NOTE — Progress Notes (Signed)
Physical Therapy Treatment Patient Details Name: Breanna Montes MRN: [id number] DOB: [birth year] Today's Date: 08/20/2017    History of Present Illness Pt is an 84 y/o female s/p R TKA. PMH includes osteoporosis and R foot surgery.     PT Comments    Patient continues to make progress toward mobility goals and demonstrated improved gait mechanics this session. Pt c/o dizziness and nausea at times during session that subsided when sitting. Continue to progress as tolerated. Current plan remains appropriate.    Follow Up Recommendations  Follow surgeon's recommendation for DC plan and follow-up therapies;Supervision for mobility/OOB     Equipment Recommendations  None recommended by PT    Recommendations for Other Services OT consult     Precautions / Restrictions Precautions Precautions: Knee Precaution Comments: knee precautions reviewed with pt Restrictions Weight Bearing Restrictions: Yes RLE Weight Bearing: Weight bearing as tolerated    Mobility  Bed Mobility Overal bed mobility: Modified Independent Bed Mobility: Supine to Sit           General bed mobility comments: increased time and effort  Transfers Overall transfer level: Needs assistance Equipment used: Rolling walker (2 wheeled) Transfers: Sit to/from Stand Sit to Stand: Min guard         General transfer comment: min guard for safety; safe hand placement demonstrated  Ambulation/Gait Ambulation/Gait assistance: Supervision Ambulation Distance (Feet): 200 Feet Assistive device: Rolling walker (2 wheeled) Gait Pattern/deviations: Step-through pattern;Decreased weight shift to right Gait velocity: decreased   General Gait Details: cues for sequencing intially; steady gait with improved step through pattern demonstrated   Stairs            Wheelchair Mobility    Modified Rankin (Stroke Patients Only)       Balance Overall balance assessment: Needs assistance Sitting-balance support:  No upper extremity supported;Feet supported Sitting balance-Leahy Scale: Good     Standing balance support: Bilateral upper extremity supported;During functional activity Standing balance-Leahy Scale: Poor Standing balance comment: Reliant on BUE Support at this time                             Cognition Arousal/Alertness: Awake/alert Behavior During Therapy: WFL for tasks assessed/performed Overall Cognitive Status: Within Functional Limits for tasks assessed                                        Exercises Total Joint Exercises Long Arc Quad: AROM;Right;10 reps;Seated Knee Flexion: AROM;Right;5 reps;Seated;Other (comment)(10 sec holds)    General Comments        Pertinent Vitals/Pain Pain Assessment: Faces Faces Pain Scale: Hurts little more Pain Location: R knee  Pain Descriptors / Indicators: Grimacing;Guarding;Sore Pain Intervention(s): Limited activity within patient's tolerance;Monitored during session;Premedicated before session;Repositioned    Home Living                      Prior Function            PT Goals (current goals can now be found in the care plan section) Acute Rehab PT Goals PT Goal Formulation: With patient Time For Goal Achievement: 09/01/17 Potential to Achieve Goals: Good Progress towards PT goals: Progressing toward goals    Frequency    7X/week      PT Plan Current plan remains appropriate    Co-evaluation  AM-PAC PT "6 Clicks" Daily Activity  Outcome Measure  Difficulty turning over in bed (including adjusting bedclothes, sheets and blankets)?: None Difficulty moving from lying on back to sitting on the side of the bed? : A Lot Difficulty sitting down on and standing up from a chair with arms (e.g., wheelchair, bedside commode, etc,.)?: Unable Help needed moving to and from a bed to chair (including a wheelchair)?: A Little Help needed walking in hospital room?: A  Little Help needed climbing 3-5 steps with a railing? : A Little 6 Click Score: 16    End of Session Equipment Utilized During Treatment: Gait belt Activity Tolerance: Patient tolerated treatment well Patient left: with call bell/phone within reach;in chair Nurse Communication: Mobility status PT Visit Diagnosis: Unsteadiness on feet (R26.81);Other abnormalities of gait and mobility (R26.89);Pain Pain - Right/Left: Right Pain - part of body: Knee     Time: 7121-9758 PT Time Calculation (min) (ACUTE ONLY): 23 min  Charges:  $Gait Training: 8-22 mins $Therapeutic Activity: 8-22 mins                    G Codes:       Earney Navy, PTA Pager: [phone]     Darliss Cheney 08/20/2017, 10:20 AM

## 2017-08-20 NOTE — Discharge Summary (Signed)
SPORTS MEDICINE & JOINT REPLACEMENT   Lara Mulch, MD   Carlyon Shadow, PA-C [address], [place], [place]  [address]                             [phone]  PATIENT ID: Breanna Montes        MRN:  [id number]          DOB/AGE: 82-Oct-1935 / 82 y.o.    DISCHARGE SUMMARY  ADMISSION DATE:    08/18/2017 DISCHARGE DATE:   08/20/2017   ADMISSION DIAGNOSIS: primary osteoarthritis right knee    DISCHARGE DIAGNOSIS:  primary osteoarthritis right knee    ADDITIONAL DIAGNOSIS: Active Problems:   S/P total knee replacement  Past Medical History:  Diagnosis Date  . Arthritis   . GERD (gastroesophageal reflux disease)   . History of hiatal hernia   . Osteoporosis   . PONV (postoperative nausea and vomiting)     PROCEDURE: Procedure(s): RIGHT TOTAL KNEE ARTHROPLASTY on 08/18/2017  CONSULTS:    HISTORY:  See H&P in chart  HOSPITAL COURSE:  RUCHEL BRANDENBURGER is a 84 y.o. admitted on 08/18/2017 and found to have a diagnosis of primary osteoarthritis right knee.  After appropriate laboratory studies were obtained  they were taken to the operating room on 08/18/2017 and underwent Procedure(s): RIGHT TOTAL KNEE ARTHROPLASTY.   They were given perioperative antibiotics:  Anti-infectives (From admission, onward)   Start     Dose/Rate Route Frequency Ordered Stop   08/18/17 1500  clindamycin (CLEOCIN) IVPB 600 mg     600 mg 100 mL/hr over 30 Minutes Intravenous Every 6 hours 08/18/17 1410 08/18/17 2209   08/18/17 0641  ceFAZolin (ANCEF) IVPB 2g/100 mL premix     2 g 200 mL/hr over 30 Minutes Intravenous On call to O.R. 08/18/17 2706 08/18/17 2376    .  Patient given tranexamic acid IV or topical and exparel intra-operatively.  Tolerated the procedure well.    POD# 1: Vital signs were stable.  Patient denied Chest pain, shortness of breath, or calf pain.  Patient was started on Lovenox 30 mg subcutaneously twice daily at 8am.  Consults to PT, OT, and care management were made.  The patient  was weight bearing as tolerated.  CPM was placed on the operative leg 0-90 degrees for 6-8 hours a day. When out of the CPM, patient was placed in the foam block to achieve full extension. Incentive spirometry was taught.  Dressing was changed.       POD #2, Continued  PT for ambulation and exercise program.  IV saline locked.  O2 discontinued.    The remainder of the hospital course was dedicated to ambulation and strengthening.   The patient was discharged on 2 Days Post-Op in  Good condition.  Blood products given:none  DIAGNOSTIC STUDIES: Recent vital signs:  Patient Vitals for the past 24 hrs:  BP Temp Temp src Pulse Resp SpO2  08/20/17 0502 (!) 144/70 98.2 F (36.8 C) Oral 93 16 93 %  08/19/17 2025 (!) 147/74 98.3 F (36.8 C) Oral 83 16 97 %  08/19/17 1711 (!) 170/69 97.8 F (36.6 C) Oral 85 - 98 %       Recent laboratory studies: Recent Labs    08/19/17 0546 08/20/17 0847  WBC 10.0 12.2*  HGB 12.0 12.2  HCT 35.3* 36.2  PLT 248 210   Recent Labs    08/19/17 0546  NA 137  K 4.3  CL 103  CO2 25  BUN 15  CREATININE 0.76  GLUCOSE 114*  CALCIUM 8.8*   No results found for: INR, PROTIME   Recent Radiographic Studies :  No results found.  DISCHARGE INSTRUCTIONS: Discharge Instructions    CPM   Complete by:  As directed    Continuous passive motion machine (CPM):      Use the CPM from 0 to 90 for 4-6 hours per day.      You may increase by 10 per day.  You may break it up into 2 or 3 sessions per day.      Use CPM for 2 weeks or until you are told to stop.   Call MD / Call 911   Complete by:  As directed    If you experience chest pain or shortness of breath, CALL 911 and be transported to the hospital emergency room.  If you develope a fever above 101 F, pus (white drainage) or increased drainage or redness at the wound, or calf pain, call your surgeon's office.   Constipation Prevention   Complete by:  As directed    Drink plenty of fluids.  Prune juice  may be helpful.  You may use a stool softener, such as Colace (over the counter) 100 mg twice a day.  Use MiraLax (over the counter) for constipation as needed.   Diet - low sodium heart healthy   Complete by:  As directed    Discharge instructions   Complete by:  As directed    INSTRUCTIONS AFTER JOINT REPLACEMENT   Remove items at home which could result in a fall. This includes throw rugs or furniture in walking pathways ICE to the affected joint every three hours while awake for 30 minutes at a time, for at least the first 3-5 days, and then as needed for pain and swelling.  Continue to use ice for pain and swelling. You may notice swelling that will progress down to the foot and ankle.  This is normal after surgery.  Elevate your leg when you are not up walking on it.   Continue to use the breathing machine you got in the hospital (incentive spirometer) which will help keep your temperature down.  It is common for your temperature to cycle up and down following surgery, especially at night when you are not up moving around and exerting yourself.  The breathing machine keeps your lungs expanded and your temperature down.   DIET:  As you were doing prior to hospitalization, we recommend a well-balanced diet.  DRESSING / WOUND CARE / SHOWERING  Keep the surgical dressing until follow up.  The dressing is water proof, so you can shower without any extra covering.  IF THE DRESSING FALLS OFF or the wound gets wet inside, change the dressing with sterile gauze.  Please use good hand washing techniques before changing the dressing.  Do not use any lotions or creams on the incision until instructed by your surgeon.    ACTIVITY  Increase activity slowly as tolerated, but follow the weight bearing instructions below.   No driving for 6 weeks or until further direction given by your physician.  You cannot drive while taking narcotics.  No lifting or carrying greater than 10 lbs. until further directed  by your surgeon. Avoid periods of inactivity such as sitting longer than an hour when not asleep. This helps prevent blood clots.  You may return to work once you are authorized by your  doctor.     WEIGHT BEARING   Weight bearing as tolerated with assist device (walker, cane, etc) as directed, use it as long as suggested by your surgeon or therapist, typically at least 4-6 weeks.   EXERCISES  Results after joint replacement surgery are often greatly improved when you follow the exercise, range of motion and muscle strengthening exercises prescribed by your doctor. Safety measures are also important to protect the joint from further injury. Any time any of these exercises cause you to have increased pain or swelling, decrease what you are doing until you are comfortable again and then slowly increase them. If you have problems or questions, call your caregiver or physical therapist for advice.   Rehabilitation is important following a joint replacement. After just a few days of immobilization, the muscles of the leg can become weakened and shrink (atrophy).  These exercises are designed to build up the tone and strength of the thigh and leg muscles and to improve motion. Often times heat used for twenty to thirty minutes before working out will loosen up your tissues and help with improving the range of motion but do not use heat for the first two weeks following surgery (sometimes heat can increase post-operative swelling).   These exercises can be done on a training (exercise) mat, on the floor, on a table or on a bed. Use whatever works the best and is most comfortable for you.    Use music or television while you are exercising so that the exercises are a pleasant break in your day. This will make your life better with the exercises acting as a break in your routine that you can look forward to.   Perform all exercises about fifteen times, three times per day or as directed.  You should exercise  both the operative leg and the other leg as well.   Exercises include:   Quad Sets - Tighten up the muscle on the front of the thigh (Quad) and hold for 5-10 seconds.   Straight Leg Raises - With your knee straight (if you were given a brace, keep it on), lift the leg to 60 degrees, hold for 3 seconds, and slowly lower the leg.  Perform this exercise against resistance later as your leg gets stronger.  Leg Slides: Lying on your back, slowly slide your foot toward your buttocks, bending your knee up off the floor (only go as far as is comfortable). Then slowly slide your foot back down until your leg is flat on the floor again.  Angel Wings: Lying on your back spread your legs to the side as far apart as you can without causing discomfort.  Hamstring Strength:  Lying on your back, push your heel against the floor with your leg straight by tightening up the muscles of your buttocks.  Repeat, but this time bend your knee to a comfortable angle, and push your heel against the floor.  You may put a pillow under the heel to make it more comfortable if necessary.   A rehabilitation program following joint replacement surgery can speed recovery and prevent re-injury in the future due to weakened muscles. Contact your doctor or a physical therapist for more information on knee rehabilitation.    CONSTIPATION  Constipation is defined medically as fewer than three stools per week and severe constipation as less than one stool per week.  Even if you have a regular bowel pattern at home, your normal regimen is likely to be disrupted due to  multiple reasons following surgery.  Combination of anesthesia, postoperative narcotics, change in appetite and fluid intake all can affect your bowels.   YOU MUST use at least one of the following options; they are listed in order of increasing strength to get the job done.  They are all available over the counter, and you may need to use some, POSSIBLY even all of these  options:    Drink plenty of fluids (prune juice may be helpful) and high fiber foods Colace 100 mg by mouth twice a day  Senokot for constipation as directed and as needed Dulcolax (bisacodyl), take with full glass of water  Miralax (polyethylene glycol) once or twice a day as needed.  If you have tried all these things and are unable to have a bowel movement in the first 3-4 days after surgery call either your surgeon or your primary doctor.    If you experience loose stools or diarrhea, hold the medications until you stool forms back up.  If your symptoms do not get better within 1 week or if they get worse, check with your doctor.  If you experience "the worst abdominal pain ever" or develop nausea or vomiting, please contact the office immediately for further recommendations for treatment.   ITCHING:  If you experience itching with your medications, try taking only a single pain pill, or even half a pain pill at a time.  You can also use Benadryl over the counter for itching or also to help with sleep.   TED HOSE STOCKINGS:  Use stockings on both legs until for at least 2 weeks or as directed by physician office. They may be removed at night for sleeping.  MEDICATIONS:  See your medication summary on the "After Visit Summary" that nursing will review with you.  You may have some home medications which will be placed on hold until you complete the course of blood thinner medication.  It is important for you to complete the blood thinner medication as prescribed.  PRECAUTIONS:  If you experience chest pain or shortness of breath - call 911 immediately for transfer to the hospital emergency department.   If you develop a fever greater that 101 F, purulent drainage from wound, increased redness or drainage from wound, foul odor from the wound/dressing, or calf pain - CONTACT YOUR SURGEON.                                                   FOLLOW-UP APPOINTMENTS:  If you do not already have a  post-op appointment, please call the office for an appointment to be seen by your surgeon.  Guidelines for how soon to be seen are listed in your "After Visit Summary", but are typically between 1-4 weeks after surgery.  OTHER INSTRUCTIONS:   Knee Replacement:  Do not place pillow under knee, focus on keeping the knee straight while resting. CPM instructions: 0-90 degrees, 2 hours in the morning, 2 hours in the afternoon, and 2 hours in the evening. Place foam block, curve side up under heel at all times except when in CPM or when walking.  DO NOT modify, tear, cut, or change the foam block in any way.  MAKE SURE YOU:  Understand these instructions.  Get help right away if you are not doing well or get worse.    Thank you for  letting us be a part of your medical care team.  It is a privilege we respect greatly.  We hope these instructions will help you stay on track for a fast and full recovery!   Increase activity slowly as tolerated   Complete by:  As directed       DISCHARGE MEDICATIONS:   Allergies as of 08/20/2017      Reactions   Codeine Nausea And Vomiting   Penicillins Rash   Has patient had a PCN reaction causing immediate rash, facial/tongue/throat swelling, SOB or lightheadedness with hypotension: Unknown Has patient had a PCN reaction causing severe rash involving mucus membranes or skin necrosis: Unknown Has patient had a PCN reaction that required hospitalization: No Has patient had a PCN reaction occurring within the last 10 years: No If all of the above answers are "NO", then may proceed with Cephalosporin use.      Medication List    STOP taking these medications   OVER THE COUNTER MEDICATION     TAKE these medications   aspirin 325 MG EC tablet Take 1 tablet (325 mg total) by mouth 2 (two) times daily.   CALCIUM 1000 + D PO Take 1 tablet by mouth at bedtime.   cycloSPORINE 0.05 % ophthalmic emulsion Commonly known as:  RESTASIS Place 1 drop into both eyes 4  (four) times daily as needed (3-4 TIMES DAILY).   docusate sodium 100 MG capsule Commonly known as:  COLACE Take 100 mg by mouth every evening.   MAXIMUM D3 10000 units Caps Generic drug:  Cholecalciferol Take 10,000 Units by mouth at bedtime. VITAMIN D3   methocarbamol 500 MG tablet Commonly known as:  ROBAXIN Take 1-2 tablets (500-1,000 mg total) by mouth every 6 (six) hours as needed for muscle spasms.   omeprazole 20 MG capsule Commonly known as:  PRILOSEC Take 20 mg by mouth daily before breakfast.   Oxycodone HCl 10 MG Tabs Take 1 tablet (10 mg total) by mouth every 4 (four) hours as needed for moderate pain (pain score 4-6).   polyethylene glycol powder powder Commonly known as:  GLYCOLAX/MIRALAX Take 17 g by mouth every evening.            Durable Medical Equipment  (From admission, onward)        Start     Ordered   08/18/17 1410  DME Walker rolling  Once    Question:  Patient needs a walker to treat with the following condition  Answer:  S/P total knee replacement   08/18/17 1410   08/18/17 1410  DME 3 n 1  Once     08/18/17 1410   08/18/17 1410  DME Bedside commode  Once    Question:  Patient needs a bedside commode to treat with the following condition  Answer:  S/P total knee replacement   08/18/17 1410      FOLLOW UP VISIT:   Follow-up Information    [facility] Follow up.   Specialty:  [place] Why:  A representative from [facility] will contact you to arrange start date and time for your therapy. Contact information: [address] [place] [address] [phone]           DISPOSITION: HOME VS. SNF  CONDITION:  Good   Donia Ast 08/20/2017, 1:31 PM

## 2017-08-20 NOTE — Progress Notes (Signed)
Orthopedic Tech Progress Note Patient Details:  Breanna Montes 03/28/1934 [id number]  Patient ID: [name], female   DOB: [birth year], 84 y.o.   MRN: [id number]   [name] 08/20/2017, 12:56 PM Placed pt's rle on cpm @0 -45 degrees @1245 ; will increase as pt tolerates; RN notified

## 2017-08-20 NOTE — Op Note (Signed)
TOTAL KNEE REPLACEMENT OPERATIVE NOTE:  08/18/2017  6:23 AM  PATIENT:  Breanna Montes  84 y.o. female  PRE-OPERATIVE DIAGNOSIS:  primary osteoarthritis right knee  POST-OPERATIVE DIAGNOSIS:  primary osteoarthritis right knee  PROCEDURE:  Procedure(s): RIGHT TOTAL KNEE ARTHROPLASTY  SURGEON:  Surgeon(s): Vickey Huger, MD  PHYSICIAN ASSISTANT: Nehemiah Massed, [facility]  ANESTHESIA:   spinal  SPECIMEN: None  COUNTS:  Correct  TOURNIQUET:   Total Tourniquet Time Documented: Thigh (Right) - 44 minutes Total: Thigh (Right) - 44 minutes   DICTATION:  Indication for procedure:    The patient is a 84 y.o. female who has failed conservative treatment for primary osteoarthritis right knee.  Informed consent was obtained prior to anesthesia. The risks versus benefits of the operation were explain and in a way the patient can, and did, understand.   On the implant demand matching protocol, this patient scored 8.  Therefore, this patient did" "did not receive a polyethylene insert with vitamin E which is a high demand implant.  Description of procedure:     The patient was taken to the operating room and placed under anesthesia.  The patient was positioned in the usual fashion taking care that all body parts were adequately padded and/or protected.  I foley catheter was not placed.  A tourniquet was applied and the leg prepped and draped in the usual sterile fashion.  The extremity was exsanguinated with the esmarch and tourniquet inflated to 350 mmHg.  Pre-operative range of motion was normal.  The knee was in 5 degree of mild valgus.  A midline incision approximately 6-7 inches long was made with a #10 blade.  A new blade was used to make a parapatellar arthrotomy going 2-3 cm into the quadriceps tendon, over the patella, and alongside the medial aspect of the patellar tendon.  A synovectomy was then performed with the #10 blade and forceps. I then elevated the deep MCL off the medial tibial  metaphysis subperiosteally around to the semimembranosus attachment.    I everted the patella and used calipers to measure patellar thickness.  I used the reamer to ream down to appropriate thickness to recreate the native thickness.  I then removed excess bone with the rongeur and sagittal saw.  I used the appropriately sized template and drilled the three lug holes.  I then put the trial in place and measured the thickness with the calipers to ensure recreation of the native thickness.  The trial was then removed and the patella subluxed and the knee brought into flexion.  A homan retractor was place to retract and protect the patella and lateral structures.  A Z-retractor was place medially to protect the medial structures.  The extra-medullary alignment system was used to make cut the tibial articular surface perpendicular to the anamotic axis of the tibia and in 3 degrees of posterior slope.  The cut surface and alignment jig was removed.  I then used the intramedullary alignment guide to make a 4 valgus cut on the distal femur.  I then marked out the epicondylar axis on the distal femur.  The posterior condylar axis measured 3 degrees.  I then used the anterior referencing sizer and measured the femur to be a size 6.  The 4-In-1 cutting block was screwed into place in external rotation matching the posterior condylar angle, making our cuts perpendicular to the epicondylar axis.  Anterior, posterior and chamfer cuts were made with the sagittal saw.  The cutting block and cut pieces were removed.  A lamina spreader was placed in 90 degrees of flexion.  The ACL, PCL, menisci, and posterior condylar osteophytes were removed.  A 10 mm spacer blocked was found to offer good flexion and extension gap balance after minimal in degree releasing.   The scoop retractor was then placed and the femoral finishing block was pinned in place.  The small sagittal saw was used as well as the lug drill to finish the femur.   The block and cut surfaces were removed and the medullary canal hole filled with autograft bone from the cut pieces.  The tibia was delivered forward in deep flexion and external rotation.  A size D tray was selected and pinned into place centered on the medial 1/3 of the tibial tubercle.  The reamer and keel was used to prepare the tibia through the tray.    I then trialed with the size 6 femur, size D tibia, a 10 mm insert and the 32 patella.  I had excellent flexion/extension gap balance, excellent patella tracking.  Flexion was full and beyond 120 degrees; extension was zero.  These components were chosen and the staff opened them to me on the back table while the knee was lavaged copiously and the cement mixed.  The soft tissue was infiltrated with 60cc of exparel 1.3% through a 21 gauge needle.  I cemented in the components and removed all excess cement.  The polyethylene tibial component was snapped into place and the knee placed in extension while cement was hardening.  The capsule was infilltrated with 30cc of .25% Marcaine with epinephrine.  A hemovac was place in the joint exiting superolaterally.  A pain pump was place superomedially superficial to the arthrotomy.  Once the cement was hard, the tourniquet was let down.  Hemostasis was obtained.  The arthrotomy was closed with figure-8 #1 vicryl sutures.  The deep soft tissues were closed with #0 vicryls and the subcuticular layer closed with a running #2-0 vicryl.  The skin was reapproximated and closed with skin staples.  The wound was dressed with xeroform, 4 x4's, 2 ABD sponges, a single layer of webril and a TED stocking.   The patient was then awakened, extubated, and taken to the recovery room in stable condition.  BLOOD LOSS:  300cc DRAINS: 1 hemovac, 1 pain catheter COMPLICATIONS:  None.  PLAN OF CARE: Admit to inpatient   PATIENT DISPOSITION:  PACU - hemodynamically stable.   Delay start of Pharmacological VTE agent (>24hrs)  due to surgical blood loss or risk of bleeding:  not applicable  Please fax a copy of this op note to my office at [phone] (please only include page 1 and 2 of the Case Information op note)

## 2017-08-21 DIAGNOSIS — Z9181 History of falling: Secondary | ICD-10-CM | POA: Diagnosis not present

## 2017-08-21 DIAGNOSIS — Z471 Aftercare following joint replacement surgery: Secondary | ICD-10-CM | POA: Diagnosis not present

## 2017-08-21 DIAGNOSIS — M810 Age-related osteoporosis without current pathological fracture: Secondary | ICD-10-CM | POA: Diagnosis not present

## 2017-08-21 DIAGNOSIS — M1991 Primary osteoarthritis, unspecified site: Secondary | ICD-10-CM | POA: Diagnosis not present

## 2017-08-21 DIAGNOSIS — M1711 Unilateral primary osteoarthritis, right knee: Secondary | ICD-10-CM | POA: Diagnosis not present

## 2017-08-21 DIAGNOSIS — Z96651 Presence of right artificial knee joint: Secondary | ICD-10-CM | POA: Diagnosis not present

## 2017-08-21 DIAGNOSIS — Z7982 Long term (current) use of aspirin: Secondary | ICD-10-CM | POA: Diagnosis not present

## 2017-08-23 DIAGNOSIS — M1991 Primary osteoarthritis, unspecified site: Secondary | ICD-10-CM | POA: Diagnosis not present

## 2017-08-23 DIAGNOSIS — Z9181 History of falling: Secondary | ICD-10-CM | POA: Diagnosis not present

## 2017-08-23 DIAGNOSIS — Z96651 Presence of right artificial knee joint: Secondary | ICD-10-CM | POA: Diagnosis not present

## 2017-08-23 DIAGNOSIS — Z7982 Long term (current) use of aspirin: Secondary | ICD-10-CM | POA: Diagnosis not present

## 2017-08-23 DIAGNOSIS — M810 Age-related osteoporosis without current pathological fracture: Secondary | ICD-10-CM | POA: Diagnosis not present

## 2017-08-23 DIAGNOSIS — Z471 Aftercare following joint replacement surgery: Secondary | ICD-10-CM | POA: Diagnosis not present

## 2017-08-28 DIAGNOSIS — Z96651 Presence of right artificial knee joint: Secondary | ICD-10-CM | POA: Diagnosis not present

## 2017-09-01 DIAGNOSIS — Z9181 History of falling: Secondary | ICD-10-CM | POA: Diagnosis not present

## 2017-09-01 DIAGNOSIS — Z96651 Presence of right artificial knee joint: Secondary | ICD-10-CM | POA: Diagnosis not present

## 2017-09-01 DIAGNOSIS — M1991 Primary osteoarthritis, unspecified site: Secondary | ICD-10-CM | POA: Diagnosis not present

## 2017-09-01 DIAGNOSIS — Z7982 Long term (current) use of aspirin: Secondary | ICD-10-CM | POA: Diagnosis not present

## 2017-09-01 DIAGNOSIS — M810 Age-related osteoporosis without current pathological fracture: Secondary | ICD-10-CM | POA: Diagnosis not present

## 2017-09-01 DIAGNOSIS — Z471 Aftercare following joint replacement surgery: Secondary | ICD-10-CM | POA: Diagnosis not present

## 2017-09-03 DIAGNOSIS — M1711 Unilateral primary osteoarthritis, right knee: Secondary | ICD-10-CM | POA: Diagnosis not present

## 2017-09-03 DIAGNOSIS — M25561 Pain in right knee: Secondary | ICD-10-CM | POA: Diagnosis not present

## 2017-09-03 DIAGNOSIS — M25661 Stiffness of right knee, not elsewhere classified: Secondary | ICD-10-CM | POA: Diagnosis not present

## 2017-09-03 DIAGNOSIS — G8929 Other chronic pain: Secondary | ICD-10-CM | POA: Diagnosis not present

## 2017-09-03 DIAGNOSIS — R2689 Other abnormalities of gait and mobility: Secondary | ICD-10-CM | POA: Diagnosis not present

## 2017-09-15 DIAGNOSIS — M25661 Stiffness of right knee, not elsewhere classified: Secondary | ICD-10-CM | POA: Diagnosis not present

## 2017-09-15 DIAGNOSIS — R2689 Other abnormalities of gait and mobility: Secondary | ICD-10-CM | POA: Diagnosis not present

## 2017-09-15 DIAGNOSIS — M25561 Pain in right knee: Secondary | ICD-10-CM | POA: Diagnosis not present

## 2017-09-15 DIAGNOSIS — G8929 Other chronic pain: Secondary | ICD-10-CM | POA: Diagnosis not present

## 2017-09-15 DIAGNOSIS — M1711 Unilateral primary osteoarthritis, right knee: Secondary | ICD-10-CM | POA: Diagnosis not present

## 2017-10-15 DIAGNOSIS — M1711 Unilateral primary osteoarthritis, right knee: Secondary | ICD-10-CM | POA: Diagnosis not present

## 2017-10-15 DIAGNOSIS — M25561 Pain in right knee: Secondary | ICD-10-CM | POA: Diagnosis not present

## 2017-10-15 DIAGNOSIS — G8929 Other chronic pain: Secondary | ICD-10-CM | POA: Diagnosis not present

## 2017-10-15 DIAGNOSIS — M25661 Stiffness of right knee, not elsewhere classified: Secondary | ICD-10-CM | POA: Diagnosis not present

## 2017-10-15 DIAGNOSIS — R2689 Other abnormalities of gait and mobility: Secondary | ICD-10-CM | POA: Diagnosis not present

## 2017-10-27 DIAGNOSIS — M25561 Pain in right knee: Secondary | ICD-10-CM | POA: Diagnosis not present

## 2017-10-27 DIAGNOSIS — M1711 Unilateral primary osteoarthritis, right knee: Secondary | ICD-10-CM | POA: Diagnosis not present

## 2017-10-27 DIAGNOSIS — R2689 Other abnormalities of gait and mobility: Secondary | ICD-10-CM | POA: Diagnosis not present

## 2017-10-27 DIAGNOSIS — M25661 Stiffness of right knee, not elsewhere classified: Secondary | ICD-10-CM | POA: Diagnosis not present

## 2017-11-11 DIAGNOSIS — M67442 Ganglion, left hand: Secondary | ICD-10-CM | POA: Diagnosis not present

## 2017-11-26 DIAGNOSIS — M1711 Unilateral primary osteoarthritis, right knee: Secondary | ICD-10-CM | POA: Diagnosis not present

## 2017-11-26 DIAGNOSIS — Z471 Aftercare following joint replacement surgery: Secondary | ICD-10-CM | POA: Diagnosis not present

## 2017-11-26 DIAGNOSIS — M25661 Stiffness of right knee, not elsewhere classified: Secondary | ICD-10-CM | POA: Diagnosis not present

## 2017-11-26 DIAGNOSIS — R2689 Other abnormalities of gait and mobility: Secondary | ICD-10-CM | POA: Diagnosis not present

## 2017-11-26 DIAGNOSIS — Z96651 Presence of right artificial knee joint: Secondary | ICD-10-CM | POA: Diagnosis not present

## 2017-12-08 DIAGNOSIS — G8929 Other chronic pain: Secondary | ICD-10-CM | POA: Diagnosis not present

## 2017-12-08 DIAGNOSIS — Z96651 Presence of right artificial knee joint: Secondary | ICD-10-CM | POA: Diagnosis not present

## 2017-12-08 DIAGNOSIS — M25861 Other specified joint disorders, right knee: Secondary | ICD-10-CM | POA: Diagnosis not present

## 2017-12-08 DIAGNOSIS — M25561 Pain in right knee: Secondary | ICD-10-CM | POA: Diagnosis not present

## 2017-12-08 DIAGNOSIS — R2689 Other abnormalities of gait and mobility: Secondary | ICD-10-CM | POA: Diagnosis not present

## 2017-12-08 DIAGNOSIS — M1711 Unilateral primary osteoarthritis, right knee: Secondary | ICD-10-CM | POA: Diagnosis not present

## 2017-12-16 DIAGNOSIS — M25561 Pain in right knee: Secondary | ICD-10-CM | POA: Diagnosis not present

## 2017-12-16 DIAGNOSIS — M1711 Unilateral primary osteoarthritis, right knee: Secondary | ICD-10-CM | POA: Diagnosis not present

## 2017-12-16 DIAGNOSIS — M25661 Stiffness of right knee, not elsewhere classified: Secondary | ICD-10-CM | POA: Diagnosis not present

## 2017-12-16 DIAGNOSIS — R2689 Other abnormalities of gait and mobility: Secondary | ICD-10-CM | POA: Diagnosis not present

## 2017-12-16 DIAGNOSIS — N390 Urinary tract infection, site not specified: Secondary | ICD-10-CM | POA: Diagnosis not present

## 2017-12-16 DIAGNOSIS — R350 Frequency of micturition: Secondary | ICD-10-CM | POA: Diagnosis not present

## 2017-12-16 DIAGNOSIS — N898 Other specified noninflammatory disorders of vagina: Secondary | ICD-10-CM | POA: Diagnosis not present

## 2017-12-16 DIAGNOSIS — G8929 Other chronic pain: Secondary | ICD-10-CM | POA: Diagnosis not present

## 2017-12-19 DIAGNOSIS — Z96651 Presence of right artificial knee joint: Secondary | ICD-10-CM | POA: Diagnosis not present

## 2017-12-19 DIAGNOSIS — R2689 Other abnormalities of gait and mobility: Secondary | ICD-10-CM | POA: Diagnosis not present

## 2017-12-19 DIAGNOSIS — M1711 Unilateral primary osteoarthritis, right knee: Secondary | ICD-10-CM | POA: Diagnosis not present

## 2017-12-19 DIAGNOSIS — M25661 Stiffness of right knee, not elsewhere classified: Secondary | ICD-10-CM | POA: Diagnosis not present

## 2017-12-19 DIAGNOSIS — Z471 Aftercare following joint replacement surgery: Secondary | ICD-10-CM | POA: Diagnosis not present

## 2018-02-27 DIAGNOSIS — Z471 Aftercare following joint replacement surgery: Secondary | ICD-10-CM | POA: Diagnosis not present

## 2018-02-27 DIAGNOSIS — Z96651 Presence of right artificial knee joint: Secondary | ICD-10-CM | POA: Diagnosis not present

## 2018-04-01 DIAGNOSIS — H02831 Dermatochalasis of right upper eyelid: Secondary | ICD-10-CM | POA: Diagnosis not present

## 2018-04-01 DIAGNOSIS — H26491 Other secondary cataract, right eye: Secondary | ICD-10-CM | POA: Diagnosis not present

## 2018-04-01 DIAGNOSIS — H532 Diplopia: Secondary | ICD-10-CM | POA: Diagnosis not present

## 2018-04-01 DIAGNOSIS — H35371 Puckering of macula, right eye: Secondary | ICD-10-CM | POA: Diagnosis not present

## 2018-04-16 DIAGNOSIS — H02831 Dermatochalasis of right upper eyelid: Secondary | ICD-10-CM | POA: Diagnosis not present

## 2018-04-16 DIAGNOSIS — H02413 Mechanical ptosis of bilateral eyelids: Secondary | ICD-10-CM | POA: Diagnosis not present

## 2018-04-16 DIAGNOSIS — H53483 Generalized contraction of visual field, bilateral: Secondary | ICD-10-CM | POA: Diagnosis not present

## 2018-04-16 DIAGNOSIS — H02423 Myogenic ptosis of bilateral eyelids: Secondary | ICD-10-CM | POA: Diagnosis not present

## 2018-04-16 DIAGNOSIS — H57813 Brow ptosis, bilateral: Secondary | ICD-10-CM | POA: Diagnosis not present

## 2018-04-16 DIAGNOSIS — H02834 Dermatochalasis of left upper eyelid: Secondary | ICD-10-CM | POA: Diagnosis not present

## 2018-04-16 DIAGNOSIS — H0279 Other degenerative disorders of eyelid and periocular area: Secondary | ICD-10-CM | POA: Diagnosis not present

## 2018-05-12 DIAGNOSIS — R350 Frequency of micturition: Secondary | ICD-10-CM | POA: Diagnosis not present

## 2018-05-12 DIAGNOSIS — R102 Pelvic and perineal pain: Secondary | ICD-10-CM | POA: Diagnosis not present

## 2018-06-02 DIAGNOSIS — N390 Urinary tract infection, site not specified: Secondary | ICD-10-CM | POA: Diagnosis not present

## 2018-06-04 DIAGNOSIS — Z79899 Other long term (current) drug therapy: Secondary | ICD-10-CM | POA: Diagnosis not present

## 2018-06-05 DIAGNOSIS — K5710 Diverticulosis of small intestine without perforation or abscess without bleeding: Secondary | ICD-10-CM | POA: Diagnosis not present

## 2018-06-05 DIAGNOSIS — R1030 Lower abdominal pain, unspecified: Secondary | ICD-10-CM | POA: Diagnosis not present

## 2018-06-05 DIAGNOSIS — K449 Diaphragmatic hernia without obstruction or gangrene: Secondary | ICD-10-CM | POA: Diagnosis not present

## 2018-06-05 DIAGNOSIS — K5730 Diverticulosis of large intestine without perforation or abscess without bleeding: Secondary | ICD-10-CM | POA: Diagnosis not present

## 2018-06-30 DIAGNOSIS — H02423 Myogenic ptosis of bilateral eyelids: Secondary | ICD-10-CM | POA: Diagnosis not present

## 2018-06-30 DIAGNOSIS — H02834 Dermatochalasis of left upper eyelid: Secondary | ICD-10-CM | POA: Diagnosis not present

## 2018-06-30 DIAGNOSIS — H57813 Brow ptosis, bilateral: Secondary | ICD-10-CM | POA: Diagnosis not present

## 2018-06-30 DIAGNOSIS — H53483 Generalized contraction of visual field, bilateral: Secondary | ICD-10-CM | POA: Diagnosis not present

## 2018-06-30 DIAGNOSIS — H02831 Dermatochalasis of right upper eyelid: Secondary | ICD-10-CM | POA: Diagnosis not present

## 2018-06-30 DIAGNOSIS — H0279 Other degenerative disorders of eyelid and periocular area: Secondary | ICD-10-CM | POA: Diagnosis not present

## 2018-06-30 DIAGNOSIS — H02413 Mechanical ptosis of bilateral eyelids: Secondary | ICD-10-CM | POA: Diagnosis not present

## 2018-07-13 DIAGNOSIS — E782 Mixed hyperlipidemia: Secondary | ICD-10-CM | POA: Diagnosis not present

## 2018-07-13 DIAGNOSIS — E559 Vitamin D deficiency, unspecified: Secondary | ICD-10-CM | POA: Diagnosis not present

## 2018-07-13 DIAGNOSIS — R05 Cough: Secondary | ICD-10-CM | POA: Diagnosis not present

## 2018-07-13 DIAGNOSIS — Z0000 Encounter for general adult medical examination without abnormal findings: Secondary | ICD-10-CM | POA: Diagnosis not present

## 2018-07-13 DIAGNOSIS — M810 Age-related osteoporosis without current pathological fracture: Secondary | ICD-10-CM | POA: Diagnosis not present

## 2018-07-13 DIAGNOSIS — R490 Dysphonia: Secondary | ICD-10-CM | POA: Diagnosis not present

## 2018-07-13 DIAGNOSIS — K219 Gastro-esophageal reflux disease without esophagitis: Secondary | ICD-10-CM | POA: Diagnosis not present

## 2018-07-13 DIAGNOSIS — R102 Pelvic and perineal pain: Secondary | ICD-10-CM | POA: Diagnosis not present

## 2018-07-13 DIAGNOSIS — Z780 Asymptomatic menopausal state: Secondary | ICD-10-CM | POA: Diagnosis not present

## 2018-07-13 DIAGNOSIS — J4520 Mild intermittent asthma, uncomplicated: Secondary | ICD-10-CM | POA: Diagnosis not present

## 2018-07-13 DIAGNOSIS — K5909 Other constipation: Secondary | ICD-10-CM | POA: Diagnosis not present

## 2018-08-27 DIAGNOSIS — Z96651 Presence of right artificial knee joint: Secondary | ICD-10-CM | POA: Diagnosis not present

## 2018-11-03 DIAGNOSIS — L905 Scar conditions and fibrosis of skin: Secondary | ICD-10-CM | POA: Diagnosis not present

## 2018-12-01 DIAGNOSIS — Z96651 Presence of right artificial knee joint: Secondary | ICD-10-CM | POA: Diagnosis not present

## 2018-12-01 DIAGNOSIS — M79671 Pain in right foot: Secondary | ICD-10-CM | POA: Diagnosis not present

## 2018-12-08 DIAGNOSIS — M810 Age-related osteoporosis without current pathological fracture: Secondary | ICD-10-CM | POA: Diagnosis not present

## 2018-12-08 DIAGNOSIS — Z780 Asymptomatic menopausal state: Secondary | ICD-10-CM | POA: Diagnosis not present

## 2018-12-08 DIAGNOSIS — Z1231 Encounter for screening mammogram for malignant neoplasm of breast: Secondary | ICD-10-CM | POA: Diagnosis not present

## 2018-12-17 DIAGNOSIS — K5909 Other constipation: Secondary | ICD-10-CM | POA: Diagnosis not present

## 2018-12-17 DIAGNOSIS — K640 First degree hemorrhoids: Secondary | ICD-10-CM | POA: Diagnosis not present

## 2018-12-17 DIAGNOSIS — N816 Rectocele: Secondary | ICD-10-CM | POA: Diagnosis not present

## 2018-12-17 DIAGNOSIS — R102 Pelvic and perineal pain: Secondary | ICD-10-CM | POA: Diagnosis not present

## 2018-12-17 DIAGNOSIS — R1032 Left lower quadrant pain: Secondary | ICD-10-CM | POA: Diagnosis not present

## 2018-12-17 DIAGNOSIS — K5790 Diverticulosis of intestine, part unspecified, without perforation or abscess without bleeding: Secondary | ICD-10-CM | POA: Diagnosis not present

## 2018-12-17 DIAGNOSIS — R350 Frequency of micturition: Secondary | ICD-10-CM | POA: Diagnosis not present

## 2019-01-06 DIAGNOSIS — L905 Scar conditions and fibrosis of skin: Secondary | ICD-10-CM | POA: Diagnosis not present

## 2019-01-06 DIAGNOSIS — H0231 Blepharochalasis right upper eyelid: Secondary | ICD-10-CM | POA: Diagnosis not present

## 2019-01-06 DIAGNOSIS — H02423 Myogenic ptosis of bilateral eyelids: Secondary | ICD-10-CM | POA: Diagnosis not present

## 2019-01-06 DIAGNOSIS — H57813 Brow ptosis, bilateral: Secondary | ICD-10-CM | POA: Diagnosis not present

## 2019-01-06 DIAGNOSIS — H0234 Blepharochalasis left upper eyelid: Secondary | ICD-10-CM | POA: Diagnosis not present

## 2019-01-06 DIAGNOSIS — H0232 Blepharochalasis right lower eyelid: Secondary | ICD-10-CM | POA: Diagnosis not present

## 2019-01-06 DIAGNOSIS — H0235 Blepharochalasis left lower eyelid: Secondary | ICD-10-CM | POA: Diagnosis not present

## 2019-01-26 DIAGNOSIS — M810 Age-related osteoporosis without current pathological fracture: Secondary | ICD-10-CM | POA: Diagnosis not present

## 2019-03-09 DIAGNOSIS — R300 Dysuria: Secondary | ICD-10-CM | POA: Diagnosis not present

## 2019-03-09 DIAGNOSIS — R05 Cough: Secondary | ICD-10-CM | POA: Diagnosis not present

## 2019-03-09 DIAGNOSIS — R030 Elevated blood-pressure reading, without diagnosis of hypertension: Secondary | ICD-10-CM | POA: Diagnosis not present

## 2019-07-16 DIAGNOSIS — E559 Vitamin D deficiency, unspecified: Secondary | ICD-10-CM | POA: Diagnosis not present

## 2019-07-16 DIAGNOSIS — M810 Age-related osteoporosis without current pathological fracture: Secondary | ICD-10-CM | POA: Diagnosis not present

## 2019-07-16 DIAGNOSIS — K219 Gastro-esophageal reflux disease without esophagitis: Secondary | ICD-10-CM | POA: Diagnosis not present

## 2019-07-16 DIAGNOSIS — Z87891 Personal history of nicotine dependence: Secondary | ICD-10-CM | POA: Diagnosis not present

## 2019-07-16 DIAGNOSIS — K5909 Other constipation: Secondary | ICD-10-CM | POA: Diagnosis not present

## 2019-07-16 DIAGNOSIS — K649 Unspecified hemorrhoids: Secondary | ICD-10-CM | POA: Diagnosis not present

## 2019-07-16 DIAGNOSIS — Z0000 Encounter for general adult medical examination without abnormal findings: Secondary | ICD-10-CM | POA: Diagnosis not present

## 2019-07-16 DIAGNOSIS — Z79899 Other long term (current) drug therapy: Secondary | ICD-10-CM | POA: Diagnosis not present

## 2019-07-16 DIAGNOSIS — J4520 Mild intermittent asthma, uncomplicated: Secondary | ICD-10-CM | POA: Diagnosis not present

## 2019-07-16 DIAGNOSIS — R05 Cough: Secondary | ICD-10-CM | POA: Diagnosis not present

## 2019-07-16 DIAGNOSIS — E782 Mixed hyperlipidemia: Secondary | ICD-10-CM | POA: Diagnosis not present

## 2019-07-27 DIAGNOSIS — L7211 Pilar cyst: Secondary | ICD-10-CM | POA: Diagnosis not present

## 2019-07-27 DIAGNOSIS — D225 Melanocytic nevi of trunk: Secondary | ICD-10-CM | POA: Diagnosis not present

## 2019-07-27 DIAGNOSIS — L578 Other skin changes due to chronic exposure to nonionizing radiation: Secondary | ICD-10-CM | POA: Diagnosis not present

## 2019-07-27 DIAGNOSIS — D2261 Melanocytic nevi of right upper limb, including shoulder: Secondary | ICD-10-CM | POA: Diagnosis not present

## 2019-07-27 DIAGNOSIS — D2271 Melanocytic nevi of right lower limb, including hip: Secondary | ICD-10-CM | POA: Diagnosis not present

## 2019-07-27 DIAGNOSIS — L821 Other seborrheic keratosis: Secondary | ICD-10-CM | POA: Diagnosis not present

## 2019-07-27 DIAGNOSIS — L853 Xerosis cutis: Secondary | ICD-10-CM | POA: Diagnosis not present

## 2019-07-27 DIAGNOSIS — D2262 Melanocytic nevi of left upper limb, including shoulder: Secondary | ICD-10-CM | POA: Diagnosis not present

## 2019-07-27 DIAGNOSIS — L570 Actinic keratosis: Secondary | ICD-10-CM | POA: Diagnosis not present

## 2019-07-27 DIAGNOSIS — L814 Other melanin hyperpigmentation: Secondary | ICD-10-CM | POA: Diagnosis not present

## 2019-07-28 DIAGNOSIS — K219 Gastro-esophageal reflux disease without esophagitis: Secondary | ICD-10-CM | POA: Diagnosis not present

## 2019-08-09 DIAGNOSIS — M810 Age-related osteoporosis without current pathological fracture: Secondary | ICD-10-CM | POA: Diagnosis not present

## 2019-09-13 ENCOUNTER — Ambulatory Visit: Payer: PPO | Attending: Internal Medicine

## 2019-09-13 DIAGNOSIS — Z23 Encounter for immunization: Secondary | ICD-10-CM

## 2019-09-13 NOTE — Progress Notes (Signed)
   Covid-19 Vaccination Clinic  Name:  Breanna Montes    MRN: [id number] DOB: 09/24/1933  09/13/2019  Breanna Montes was observed post Covid-19 immunization for 15 minutes without incident. She was provided with Vaccine Information Sheet and instruction to access the V-Safe system.   Breanna Montes was instructed to call 911 with any severe reactions post vaccine: Marland Kitchen Difficulty breathing  . Swelling of face and throat  . A fast heartbeat  . A bad rash all over body  . Dizziness and weakness   Immunizations Administered    Name Date Dose VIS Date Route   Pfizer COVID-19 Vaccine 09/13/2019 10:05 AM 0.3 mL 05/28/2019 Intramuscular   Manufacturer: [place]   Lot: [id number]   [place]: [id number]

## 2019-09-20 DIAGNOSIS — M810 Age-related osteoporosis without current pathological fracture: Secondary | ICD-10-CM | POA: Diagnosis not present

## 2019-09-20 DIAGNOSIS — K219 Gastro-esophageal reflux disease without esophagitis: Secondary | ICD-10-CM | POA: Diagnosis not present

## 2019-10-05 ENCOUNTER — Ambulatory Visit: Payer: PPO | Attending: Internal Medicine

## 2019-10-05 DIAGNOSIS — Z23 Encounter for immunization: Secondary | ICD-10-CM

## 2019-10-05 NOTE — Progress Notes (Signed)
   Covid-19 Vaccination Clinic  Name:  Breanna Montes    MRN: [id number] DOB: 1933/09/10  10/05/2019  Breanna Montes was observed post Covid-19 immunization for 15 minutes without incident. She was provided with Vaccine Information Sheet and instruction to access the V-Safe system.   Breanna Montes was instructed to call 911 with any severe reactions post vaccine: Marland Kitchen Difficulty breathing  . Swelling of face and throat  . A fast heartbeat  . A bad rash all over body  . Dizziness and weakness   Immunizations Administered    Name Date Dose VIS Date Route   Pfizer COVID-19 Vaccine 10/05/2019 12:02 PM 0.3 mL 08/11/2018 Intramuscular   Manufacturer: [place]   Lot: [id number]   [place]: [id number]

## 2020-01-26 DIAGNOSIS — N898 Other specified noninflammatory disorders of vagina: Secondary | ICD-10-CM | POA: Diagnosis not present

## 2020-01-26 DIAGNOSIS — L900 Lichen sclerosus et atrophicus: Secondary | ICD-10-CM | POA: Diagnosis not present

## 2020-03-13 DIAGNOSIS — M810 Age-related osteoporosis without current pathological fracture: Secondary | ICD-10-CM | POA: Diagnosis not present

## 2020-04-05 DIAGNOSIS — H3540 Unspecified peripheral retinal degeneration: Secondary | ICD-10-CM | POA: Diagnosis not present

## 2020-04-05 DIAGNOSIS — H3589 Other specified retinal disorders: Secondary | ICD-10-CM | POA: Diagnosis not present

## 2020-04-05 DIAGNOSIS — Z961 Presence of intraocular lens: Secondary | ICD-10-CM | POA: Diagnosis not present

## 2020-04-05 DIAGNOSIS — H04123 Dry eye syndrome of bilateral lacrimal glands: Secondary | ICD-10-CM | POA: Diagnosis not present

## 2020-04-05 DIAGNOSIS — H52223 Regular astigmatism, bilateral: Secondary | ICD-10-CM | POA: Diagnosis not present

## 2020-04-05 DIAGNOSIS — H5213 Myopia, bilateral: Secondary | ICD-10-CM | POA: Diagnosis not present

## 2020-04-11 ENCOUNTER — Telehealth: Payer: Self-pay | Admitting: Gastroenterology

## 2020-04-11 NOTE — Telephone Encounter (Signed)
Ok to schedule next available appointment for office visit. Thanks

## 2020-04-11 NOTE — Telephone Encounter (Signed)
Hi Dr. Silverio Decamp., we have received a referral from patient's PCP for acid reflux. Patient saw a GI physician at [facility] this past April but woulkd like to transfer her care over to you because you were recommended by two friends. Records from [facility] are in [place]. Could you please review them and advise if you would accept patient? Thank you.

## 2020-04-11 NOTE — Telephone Encounter (Signed)
Left message to call back to sch consult with Dr. Silverio Decamp.

## 2020-04-27 DIAGNOSIS — L900 Lichen sclerosus et atrophicus: Secondary | ICD-10-CM | POA: Diagnosis not present

## 2020-04-27 DIAGNOSIS — N763 Subacute and chronic vulvitis: Secondary | ICD-10-CM | POA: Diagnosis not present

## 2020-05-04 DIAGNOSIS — Z1231 Encounter for screening mammogram for malignant neoplasm of breast: Secondary | ICD-10-CM | POA: Diagnosis not present

## 2020-06-26 ENCOUNTER — Ambulatory Visit: Payer: PPO | Admitting: Gastroenterology

## 2020-07-13 ENCOUNTER — Ambulatory Visit (INDEPENDENT_AMBULATORY_CARE_PROVIDER_SITE_OTHER): Payer: PPO | Admitting: Gastroenterology

## 2020-07-13 ENCOUNTER — Encounter: Payer: Self-pay | Admitting: Gastroenterology

## 2020-07-13 ENCOUNTER — Other Ambulatory Visit: Payer: Self-pay

## 2020-07-13 VITALS — BP 122/74 | HR 94 | Ht 62.0 in | Wt 129.0 lb

## 2020-07-13 DIAGNOSIS — K219 Gastro-esophageal reflux disease without esophagitis: Secondary | ICD-10-CM | POA: Diagnosis not present

## 2020-07-13 DIAGNOSIS — R053 Chronic cough: Secondary | ICD-10-CM

## 2020-07-13 DIAGNOSIS — R1310 Dysphagia, unspecified: Secondary | ICD-10-CM | POA: Diagnosis not present

## 2020-07-13 MED ORDER — FAMOTIDINE 20 MG PO TABS: 20.0000 mg | ORAL_TABLET | Freq: Two times a day (BID) | ORAL | 3 refills | Status: DC

## 2020-07-13 MED ORDER — FAMOTIDINE 20 MG PO TABS: 20.0000 mg | ORAL_TABLET | Freq: Two times a day (BID) | ORAL | 11 refills | Status: DC

## 2020-07-13 MED ORDER — SUCRALFATE 1 G PO TABS
1.0000 g | ORAL_TABLET | Freq: Three times a day (TID) | ORAL | 1 refills | Status: DC
Start: 2020-07-13 — End: 2020-10-11

## 2020-07-13 NOTE — Patient Instructions (Addendum)
You have been scheduled for an endoscopy/Bravo. Please follow written instructions given to you at your visit today. If you use inhalers (even only as needed), please bring them with you on the day of your procedure.  We will send Carafate and Pepcid to your pharmacy   [employer] Current Therapy 2021 (pp. 213-216). [place], [place]: Elsevier.">  Gastroesophageal Reflux Disease, Adult Gastroesophageal reflux (GER) happens when acid from the stomach flows up into the tube that connects the mouth and the stomach (esophagus). Normally, food travels down the esophagus and stays in the stomach to be digested. However, when a person has GER, food and stomach acid sometimes move back up into the esophagus. If this becomes a more serious problem, the person may be diagnosed with a disease called gastroesophageal reflux disease (GERD). GERD occurs when the reflux:  Happens often.  Causes frequent or severe symptoms.  Causes problems such as damage to the esophagus. When stomach acid comes in contact with the esophagus, the acid may cause inflammation in the esophagus. Over time, GERD may create small holes (ulcers) in the lining of the esophagus. What are the causes? This condition is caused by a problem with the muscle between the esophagus and the stomach (lower esophageal sphincter, or LES). Normally, the LES muscle closes after food passes through the esophagus to the stomach. When the LES is weakened or abnormal, it does not close properly, and that allows food and stomach acid to go back up into the esophagus. The LES can be weakened by certain dietary substances, medicines, and medical conditions, including:  Tobacco use.  Pregnancy.  Having a hiatal hernia.  Alcohol use.  Certain foods and beverages, such as coffee, chocolate, onions, and peppermint. What increases the risk? You are more likely to develop this condition if you:  Have an increased body weight.  Have a connective tissue  disorder.  Take NSAIDs, such as ibuprofen. What are the signs or symptoms? Symptoms of this condition include:  Heartburn.  Difficult or painful swallowing and the feeling of having a lump in the throat.  A bitter taste in the mouth.  Bad breath and having a large amount of saliva.  Having an upset or bloated stomach and belching.  Chest pain. Different conditions can cause chest pain. Make sure you see your health care provider if you experience chest pain.  Shortness of breath or wheezing.  Ongoing (chronic) cough or a nighttime cough.  Wearing away of tooth enamel.  Weight loss. How is this diagnosed? This condition may be diagnosed based on a medical history and a physical exam. To determine if you have mild or severe GERD, your health care provider may also monitor how you respond to treatment. You may also have tests, including:  A test to examine your stomach and esophagus with a small camera (endoscopy).  A test that measures the acidity level in your esophagus.  A test that measures how much pressure is on your esophagus.  A barium swallow or modified barium swallow test to show the shape, size, and functioning of your esophagus. How is this treated? Treatment for this condition may vary depending on how severe your symptoms are. Your health care provider may recommend:  Changes to your diet.  Medicine.  Surgery. The goal of treatment is to help relieve your symptoms and to prevent complications. Follow these instructions at home: Eating and drinking  Follow a diet as recommended by your health care provider. This may involve avoiding foods and drinks such  as: ? Coffee and tea, with or without caffeine. ? Drinks that contain alcohol. ? Energy drinks and sports drinks. ? Carbonated drinks or sodas. ? Chocolate and cocoa. ? Peppermint and mint flavorings. ? Garlic and onions. ? Horseradish. ? Spicy and acidic foods, including peppers, chili powder, curry  powder, vinegar, hot sauces, and barbecue sauce. ? Citrus fruit juices and citrus fruits, such as oranges, lemons, and limes. ? Tomato-based foods, such as red sauce, chili, salsa, and pizza with red sauce. ? Fried and fatty foods, such as donuts, french fries, potato chips, and high-fat dressings. ? High-fat meats, such as hot dogs and fatty cuts of red and white meats, such as rib eye steak, sausage, ham, and bacon. ? High-fat dairy items, such as whole milk, butter, and cream cheese.  Eat small, frequent meals instead of large meals.  Avoid drinking large amounts of liquid with your meals.  Avoid eating meals during the 2-3 hours before bedtime.  Avoid lying down right after you eat.  Do not exercise right after you eat.   Lifestyle  Do not use any products that contain nicotine or tobacco. These products include cigarettes, chewing tobacco, and vaping devices, such as e-cigarettes. If you need help quitting, ask your health care provider.  Try to reduce your stress by using methods such as yoga or meditation. If you need help reducing stress, ask your health care provider.  If you are overweight, reduce your weight to an amount that is healthy for you. Ask your health care provider for guidance about a safe weight loss goal.   General instructions  Pay attention to any changes in your symptoms.  Take over-the-counter and prescription medicines only as told by your health care provider. Do not take aspirin, ibuprofen, or other NSAIDs unless your health care provider told you to take these medicines.  Wear loose-fitting clothing. Do not wear anything tight around your waist that causes pressure on your abdomen.  Raise (elevate) the head of your bed about 6 inches (15 cm). You can use a wedge to do this.  Avoid bending over if this makes your symptoms worse.  Keep all follow-up visits. This is important. Contact a health care provider if:  You have: ? New  symptoms. ? Unexplained weight loss. ? Difficulty swallowing or it hurts to swallow. ? Wheezing or a persistent cough. ? A hoarse voice.  Your symptoms do not improve with treatment. Get help right away if:  You have sudden pain in your arms, neck, jaw, teeth, or back.  You suddenly feel sweaty, dizzy, or light-headed.  You have chest pain or shortness of breath.  You vomit and the vomit is green, yellow, or black, or it looks like blood or coffee grounds.  You faint.  You have stool that is red, bloody, or black.  You cannot swallow, drink, or eat. These symptoms may represent a serious problem that is an emergency. Do not wait to see if the symptoms will go away. Get medical help right away. Call your local emergency services (911 in the [place] Do not drive yourself to the hospital. Summary  Gastroesophageal reflux happens when acid from the stomach flows up into the esophagus. GERD is a disease in which the reflux happens often, causes frequent or severe symptoms, or causes problems such as damage to the esophagus.  Treatment for this condition may vary depending on how severe your symptoms are. Your health care provider may recommend diet and lifestyle changes, medicine, or surgery.  Contact a health care provider if you have new or worsening symptoms.  Take over-the-counter and prescription medicines only as told by your health care provider. Do not take aspirin, ibuprofen, or other NSAIDs unless your health care provider told you to do so.  Keep all follow-up visits as told by your health care provider. This is important. This information is not intended to replace advice given to you by your health care provider. Make sure you discuss any questions you have with your health care provider. Document Revised: 12/13/2019 Document Reviewed: 12/13/2019 Elsevier Patient Education  2021 [employer].   Due to recent changes in healthcare laws, you may see the results of your  imaging and laboratory studies on MyChart before your provider has had a chance to review them.  We understand that in some cases there may be results that are confusing or concerning to you. Not all laboratory results come back in the same time frame and the provider may be waiting for multiple results in order to interpret others.  Please give [place] 48 hours in order for your provider to thoroughly review all the results before contacting the office for clarification of your results.   I appreciate the  opportunity to care for you  Thank You   [name] , MD

## 2020-07-13 NOTE — Progress Notes (Signed)
Breanna Montes    [id number]    [birth year]  Primary Care Physician:Rice, Wannetta Sender, MD  Referring Physician: Garlan Fillers, MD [address] [place],  [place] [address]   Chief complaint:  Cough, GERD  HPI:  87 yr very pleasant female here for new patient visit with c/o worsening chronic cough and GERD symptoms.  She has intermittent heartburn and regurgitation. She has coughing spells through out day, worse at night time.  Also c/o intermittent difficulty swallowing. No food impaction or vomiting  She is currently taking famotidine twice daily with no improvement.  Denies any abdominal pain, melena, change in bowel habits or rectal bleeding.  No family history of GI malignancy.   Outpatient Encounter Medications as of 07/13/2020  Medication Sig  . albuterol (VENTOLIN HFA) 108 (90 Base) MCG/ACT inhaler Inhale 2 puffs into the lungs every 6 (six) hours as needed.  . AMBULATORY NON FORMULARY MEDICATION Organic Raw Apple Cider Vinegar daily  . Cholecalciferol (VITAMIN D3) 125 MCG (5000 UT) CAPS Take 1 capsule by mouth 2 (two) times a week.  . denosumab (PROLIA) 60 MG/ML SOSY injection Inject 60 mg into the skin every 6 (six) months.  Mariane Baumgarten Calcium (STOOL SOFTENER PO) Take 1 tablet by mouth daily.  . famotidine (PEPCID) 20 MG tablet Take 1 tablet by mouth 2 (two) times daily.  . Famotidine-Ca Carb-Mag Hydrox (PEPCID COMPLETE PO) Take by mouth as needed.  Javier Docker Oil 350 MG CAPS Take 1 capsule by mouth daily.  . Multiple Vitamin (MULTIVITAMIN) tablet Take 1 tablet by mouth daily.  . Multiple Vitamins-Minerals (EMERGEN-C IMMUNE) PACK Take by mouth.  . polyethylene glycol (MIRALAX / GLYCOLAX) 17 g packet Take 17 g by mouth daily.  . [DISCONTINUED] aspirin EC 325 MG EC tablet Take 1 tablet (325 mg total) by mouth 2 (two) times daily. (Patient not taking: Reported on 07/13/2020)  . [DISCONTINUED] Calcium Carb-Cholecalciferol (CALCIUM 1000 + D PO) Take 1 tablet by  mouth at bedtime. (Patient not taking: Reported on 07/13/2020)  . [DISCONTINUED] Cholecalciferol 250 MCG (10000 UT) CAPS Take 10,000 Units by mouth at bedtime. VITAMIN D3 (Patient not taking: Reported on 07/13/2020)  . [DISCONTINUED] cycloSPORINE (RESTASIS) 0.05 % ophthalmic emulsion Place 1 drop into both eyes 4 (four) times daily as needed (3-4 TIMES DAILY). (Patient not taking: Reported on 07/13/2020)  . [DISCONTINUED] docusate sodium (COLACE) 100 MG capsule Take 100 mg by mouth every evening. (Patient not taking: Reported on 07/13/2020)  . [DISCONTINUED] methocarbamol (ROBAXIN) 500 MG tablet Take 1-2 tablets (500-1,000 mg total) by mouth every 6 (six) hours as needed for muscle spasms. (Patient not taking: Reported on 07/13/2020)  . [DISCONTINUED] omeprazole (PRILOSEC) 20 MG capsule Take 20 mg by mouth daily before breakfast. (Patient not taking: Reported on 07/13/2020)  . [DISCONTINUED] oxyCODONE 10 MG TABS Take 1 tablet (10 mg total) by mouth every 4 (four) hours as needed for moderate pain (pain score 4-6). (Patient not taking: Reported on 07/13/2020)  . [DISCONTINUED] polyethylene glycol powder (GLYCOLAX/MIRALAX) powder Take 17 g by mouth every evening. (Patient not taking: Reported on 07/13/2020)   No facility-administered encounter medications on file as of 07/13/2020.    Allergies as of 07/13/2020 - Review Complete 07/13/2020  Allergen Reaction Noted  . Codeine Nausea And Vomiting 07/31/2017  . Penicillins Rash 07/31/2017    Past Medical History:  Diagnosis Date  . Arthritis   . GERD (gastroesophageal reflux disease)   .  History of hiatal hernia   . Osteoporosis   . PONV (postoperative nausea and vomiting)     Past Surgical History:  Procedure Laterality Date  . APPENDECTOMY    . BUNIONECTOMY     left    2005  . CATARACT EXTRACTION W/ INTRAOCULAR LENS  IMPLANT, BILATERAL     2009  . CERVICAL CONE BIOPSY    . cervical cryosurgery     90-95  . CHOLECYSTECTOMY     2001  . ERCP      2001  internal bleed  . FOOT SURGERY     rt  [phone]  . HEMORRHOID SURGERY     63+66  . HERNIA REPAIR     partial hip repair2017     right  . TOTAL KNEE ARTHROPLASTY Right 08/18/2017   Procedure: RIGHT TOTAL KNEE ARTHROPLASTY;  Surgeon: Vickey Huger, MD;  Location: [facility];  Service: Orthopedics;  Laterality: Right;  . TUBAL LIGATION     60    Family History  Problem Relation Age of Onset  . Emphysema Mother   . Stomach cancer Father   . Prostate cancer Sister   . Colon cancer Neg Hx   . Esophageal cancer Neg Hx   . Pancreatic cancer Neg Hx     Social History   Socioeconomic History  . Marital status: Single    Spouse name: Not on file  . Number of children: Not on file  . Years of education: Not on file  . Highest education level: Not on file  Occupational History  . Not on file  Tobacco Use  . Smoking status: Former Smoker    Types: Cigarettes  . Smokeless tobacco: Never Used  Vaping Use  . Vaping Use: Never used  Substance and Sexual Activity  . Alcohol use: No  . Drug use: No  . Sexual activity: Not on file  Other Topics Concern  . Not on file  Social History Narrative  . Not on file   Social Determinants of Health   Financial Resource Strain: Not on file  Food Insecurity: Not on file  Transportation Needs: Not on file  Physical Activity: Not on file  Stress: Not on file  Social Connections: Not on file  Intimate Partner Violence: Not on file      Review of systems: All other review of systems negative except as mentioned in the HPI.   Physical Exam: Vitals:   07/13/20 1008  BP: 122/74  Pulse: 94   Body mass index is 23.59 kg/m. Gen:      No acute distress HEENT:  sclera anicteric Abd:      soft, non-tender; no palpable masses, no distension Ext:    No edema Neuro: alert and oriented x 3 Psych: normal mood and affect  Data Reviewed:  Reviewed labs, radiology imaging, old records and pertinent past GI work up   Assessment and  Plan/Recommendations:  87 yr old very pleasant female with GERD symptoms, chronic cough, intermittent dysphagia  Will plan to proceed with EGD to evaluate for erosive esophagitis, hiatal hernia and to exclude neoplasia or malignancy Esophageal biopsies and dilation if has evidence of esophageal stricture  Also discussed possible Bravo 48 hr pH sensor placement if EGD is unremarkable to evaluate for acid reflux and see the correlation between cough and GERD  Continue Pepcid BID Antireflux measures  Use Carafate before meals and at bedtime as needed  The risks and benefits as well as alternatives of endoscopic procedure(s) have been  discussed and reviewed. All questions answered. The patient agrees to proceed.   The patient was provided an opportunity to ask questions and all were answered. The patient agreed with the plan and demonstrated an understanding of the instructions.  Damaris Hippo , MD    CC: Garlan Fillers, MD

## 2020-07-25 ENCOUNTER — Ambulatory Visit (AMBULATORY_SURGERY_CENTER): Payer: PPO | Admitting: Gastroenterology

## 2020-07-25 ENCOUNTER — Encounter: Payer: Self-pay | Admitting: Gastroenterology

## 2020-07-25 ENCOUNTER — Other Ambulatory Visit: Payer: Self-pay

## 2020-07-25 VITALS — BP 138/75 | HR 75 | Temp 97.3°F | Resp 15 | Ht 62.0 in | Wt 129.0 lb

## 2020-07-25 DIAGNOSIS — K2100 Gastro-esophageal reflux disease with esophagitis, without bleeding: Secondary | ICD-10-CM

## 2020-07-25 DIAGNOSIS — K219 Gastro-esophageal reflux disease without esophagitis: Secondary | ICD-10-CM | POA: Diagnosis not present

## 2020-07-25 DIAGNOSIS — K2970 Gastritis, unspecified, without bleeding: Secondary | ICD-10-CM

## 2020-07-25 DIAGNOSIS — K449 Diaphragmatic hernia without obstruction or gangrene: Secondary | ICD-10-CM | POA: Diagnosis not present

## 2020-07-25 DIAGNOSIS — K319 Disease of stomach and duodenum, unspecified: Secondary | ICD-10-CM

## 2020-07-25 DIAGNOSIS — K209 Esophagitis, unspecified without bleeding: Secondary | ICD-10-CM

## 2020-07-25 DIAGNOSIS — K3189 Other diseases of stomach and duodenum: Secondary | ICD-10-CM | POA: Diagnosis not present

## 2020-07-25 MED ORDER — FAMOTIDINE 20 MG PO TABS: 20.0000 mg | ORAL_TABLET | Freq: Every day | ORAL | 6 refills | Status: DC

## 2020-07-25 MED ORDER — PANTOPRAZOLE SODIUM 40 MG PO TBEC: 40.0000 mg | DELAYED_RELEASE_TABLET | Freq: Two times a day (BID) | ORAL | 1 refills | Status: DC

## 2020-07-25 MED ORDER — PANTOPRAZOLE SODIUM 40 MG PO TBEC: 40.0000 mg | DELAYED_RELEASE_TABLET | Freq: Every day | ORAL | 6 refills | Status: DC

## 2020-07-25 MED ORDER — SODIUM CHLORIDE 0.9 % IV SOLN: 500.0000 mL | Freq: Once | INTRAVENOUS | Status: DC

## 2020-07-25 NOTE — Patient Instructions (Signed)
Handouts provided on gastritis, esophagitis, hiatal hernia.   Use Protonix (pantoprazole) 40mg  by mouth twice daily for 2 months and then switch to once daily.  Use Pepcid (famotidine) 20mg  by mouth daily at bedtime.  Follow an anti-reflux regimen- see GERD, Hiatal hernia handout. Return to GI office in 3 months. Please call to schedule an appointment.   YOU HAD AN ENDOSCOPIC PROCEDURE TODAY AT [facility]:   Refer to the procedure report that was given to you for any specific questions about what was found during the examination.  If the procedure report does not answer your questions, please call your gastroenterologist to clarify.  If you requested that your care partner not be given the details of your procedure findings, then the procedure report has been included in a sealed envelope for you to review at your convenience later.  YOU SHOULD EXPECT: Some feelings of bloating in the abdomen. Passage of more gas than usual.  Walking can help get rid of the air that was put into your GI tract during the procedure and reduce the bloating. If you had a lower endoscopy (such as a colonoscopy or flexible sigmoidoscopy) you may notice spotting of blood in your stool or on the toilet paper. If you underwent a bowel prep for your procedure, you may not have a normal bowel movement for a few days.  Please Note:  You might notice some irritation and congestion in your nose or some drainage.  This is from the oxygen used during your procedure.  There is no need for concern and it should clear up in a day or so.  SYMPTOMS TO REPORT IMMEDIATELY:   Following upper endoscopy (EGD)  Vomiting of blood or coffee ground material  New chest pain or pain under the shoulder blades  Painful or persistently difficult swallowing  New shortness of breath  Fever of 100F or higher  Black, tarry-looking stools  For urgent or emergent issues, a gastroenterologist can be reached at any hour by calling  [phone]. Do not use MyChart messaging for urgent concerns.    DIET:  We do recommend a small meal at first, but then you may proceed to your regular diet.  Drink plenty of fluids but you should avoid alcoholic beverages for 24 hours.  ACTIVITY:  You should plan to take it easy for the rest of today and you should NOT DRIVE or use heavy machinery until tomorrow (because of the sedation medicines used during the test).    FOLLOW UP: Our staff will call the number listed on your records 48-72 hours following your procedure to check on you and address any questions or concerns that you may have regarding the information given to you following your procedure. If we do not reach you, we will leave a message.  We will attempt to reach you two times.  During this call, we will ask if you have developed any symptoms of COVID 19. If you develop any symptoms (ie: fever, flu-like symptoms, shortness of breath, cough etc.) before then, please call [phone].  If you test positive for Covid 19 in the 2 weeks post procedure, please call and report this information to [place].    If any biopsies were taken you will be contacted by phone or by letter within the next 1-3 weeks.  Please call us at [phone] if you have not heard about the biopsies in 3 weeks.    SIGNATURES/CONFIDENTIALITY: You and/or your care partner have signed paperwork which  will be entered into your electronic medical record.  These signatures attest to the fact that that the information above on your After Visit Summary has been reviewed and is understood.  Full responsibility of the confidentiality of this discharge information lies with you and/or your care-partner.

## 2020-07-25 NOTE — Progress Notes (Signed)
Called to room to assist during endoscopic procedure.  Patient ID and intended procedure confirmed with present staff. Received instructions for my participation in the procedure from the performing physician.  

## 2020-07-25 NOTE — Progress Notes (Signed)
A and O x3. Report to RN. Tolerated MAC anesthesia well.Teeth unchanged after procedure.

## 2020-07-25 NOTE — Op Note (Signed)
[facility] Patient Name: Breanna Montes Procedure Date: 07/25/2020 2:37 PM MRN: [id number] Endoscopist: Mauri Pole , MD Age: 87 Referring MD:  Date of Birth: 10/25/1933 Gender: Female Account #: [id number] Procedure:                Upper GI endoscopy Indications:              Dysphagia, Heartburn, Suspected esophageal reflux,                            chronic cough Medicines:                Monitored Anesthesia Care Procedure:                Pre-Anesthesia Assessment:                           - Prior to the procedure, a History and Physical                            was performed, and patient medications and                            allergies were reviewed. The patient's tolerance of                            previous anesthesia was also reviewed. The risks                            and benefits of the procedure and the sedation                            options and risks were discussed with the patient.                            All questions were answered, and informed consent                            was obtained. Prior Anticoagulants: The patient has                            taken no previous anticoagulant or antiplatelet                            agents. ASA Grade Assessment: II - A patient with                            mild systemic disease. After reviewing the risks                            and benefits, the patient was deemed in                            satisfactory condition to undergo the procedure.  After obtaining informed consent, the endoscope was                            passed under direct vision. Throughout the                            procedure, the patient's blood pressure, pulse, and                            oxygen saturations were monitored continuously. The                            Endoscope was introduced through the mouth, and                            advanced to the second part of duodenum.  The upper                            GI endoscopy was accomplished without difficulty.                            The patient tolerated the procedure well. Scope In: Scope Out: Findings:                 LA Grade C (one or more mucosal breaks continuous                            between tops of 2 or more mucosal folds, less than                            75% circumference) esophagitis with no bleeding was                            found 34 to 36 cm from the incisors. No stricture                            or stenosis. Exam otherwise normal for dysphagia                           A 4 cm hiatal hernia was present.                           Patchy mild inflammation characterized by                            congestion (edema), erosions and erythema was found                            in the gastric antrum and in the prepyloric region                            of the stomach. Biopsies were taken with a cold  forceps for Helicobacter pylori testing.                           The examined duodenum was normal. Complications:            No immediate complications. Estimated Blood Loss:     Estimated blood loss was minimal. Impression:               - LA Grade C reflux esophagitis with no bleeding.                           - 4 cm hiatal hernia.                           - Gastritis. Biopsied.                           - Normal examined duodenum. Recommendation:           - Patient has a contact number available for                            emergencies. The signs and symptoms of potential                            delayed complications were discussed with the                            patient. Return to normal activities tomorrow.                            Written discharge instructions were provided to the                            patient.                           - Resume previous diet.                           - Continue present medications.                            - Await pathology results.                           - Use Protonix (pantoprazole) 40 mg PO BID for 2                            months and then switch to once daily.                           - Use Pepcid (famotidine) 20 mg PO daily at bedtime.                           - Follow an antireflux regimen.                           -  Return to GI office in 3 months. Please call to                            schedule appointment Mauri Pole, MD 07/25/2020 2:58:08 PM This report has been signed electronically.

## 2020-07-25 NOTE — Progress Notes (Signed)
Pt's states no medical or surgical changes since previsit or office visit.  SB IV and Vitals.  Bravo pre/Post instructions reviewed with patient as well as demonstration with the device.

## 2020-07-26 ENCOUNTER — Telehealth: Payer: Self-pay | Admitting: Gastroenterology

## 2020-07-26 DIAGNOSIS — M810 Age-related osteoporosis without current pathological fracture: Secondary | ICD-10-CM | POA: Diagnosis not present

## 2020-07-26 DIAGNOSIS — K2100 Gastro-esophageal reflux disease with esophagitis, without bleeding: Secondary | ICD-10-CM | POA: Diagnosis not present

## 2020-07-26 DIAGNOSIS — J4520 Mild intermittent asthma, uncomplicated: Secondary | ICD-10-CM | POA: Diagnosis not present

## 2020-07-26 DIAGNOSIS — E559 Vitamin D deficiency, unspecified: Secondary | ICD-10-CM | POA: Diagnosis not present

## 2020-07-26 DIAGNOSIS — E782 Mixed hyperlipidemia: Secondary | ICD-10-CM | POA: Diagnosis not present

## 2020-07-26 DIAGNOSIS — Z0000 Encounter for general adult medical examination without abnormal findings: Secondary | ICD-10-CM | POA: Diagnosis not present

## 2020-07-26 NOTE — Telephone Encounter (Signed)
She can use Carafate as needed but continue Protonix before breakfast and dinner every day. Thanks

## 2020-07-26 NOTE — Telephone Encounter (Signed)
Lashawnta informed and verbalized understanding.

## 2020-07-26 NOTE — Telephone Encounter (Signed)
Pt is wanting to clarify if she should continue taking the [place] along with the pantoprazole.

## 2020-07-26 NOTE — Telephone Encounter (Signed)
Please advise, thank you.

## 2020-07-27 ENCOUNTER — Telehealth: Payer: Self-pay

## 2020-07-27 NOTE — Telephone Encounter (Signed)
No answer, left message to call if having any issues or concerns, B.Melina Mosteller RN 

## 2020-07-27 NOTE — Telephone Encounter (Signed)
No answer, left message to call back later today, B.Aileene Lanum RN. 

## 2020-08-01 ENCOUNTER — Encounter: Payer: Self-pay | Admitting: Gastroenterology

## 2020-08-02 DIAGNOSIS — N904 Leukoplakia of vulva: Secondary | ICD-10-CM | POA: Diagnosis not present

## 2020-08-02 DIAGNOSIS — N763 Subacute and chronic vulvitis: Secondary | ICD-10-CM | POA: Diagnosis not present

## 2020-09-20 ENCOUNTER — Other Ambulatory Visit: Payer: Self-pay | Admitting: Gastroenterology

## 2020-09-20 DIAGNOSIS — K2970 Gastritis, unspecified, without bleeding: Secondary | ICD-10-CM

## 2020-09-20 DIAGNOSIS — M810 Age-related osteoporosis without current pathological fracture: Secondary | ICD-10-CM | POA: Diagnosis not present

## 2020-09-20 DIAGNOSIS — K209 Esophagitis, unspecified without bleeding: Secondary | ICD-10-CM

## 2020-10-11 ENCOUNTER — Other Ambulatory Visit: Payer: Self-pay | Admitting: Gastroenterology

## 2020-10-18 ENCOUNTER — Other Ambulatory Visit: Payer: Self-pay | Admitting: Gastroenterology

## 2020-10-18 DIAGNOSIS — K209 Esophagitis, unspecified without bleeding: Secondary | ICD-10-CM

## 2020-10-18 DIAGNOSIS — K2970 Gastritis, unspecified, without bleeding: Secondary | ICD-10-CM

## 2020-10-19 ENCOUNTER — Ambulatory Visit (INDEPENDENT_AMBULATORY_CARE_PROVIDER_SITE_OTHER): Payer: PPO | Admitting: Gastroenterology

## 2020-10-19 ENCOUNTER — Encounter: Payer: Self-pay | Admitting: Gastroenterology

## 2020-10-19 VITALS — BP 118/78 | HR 78 | Ht 62.0 in | Wt 131.5 lb

## 2020-10-19 DIAGNOSIS — R053 Chronic cough: Secondary | ICD-10-CM

## 2020-10-19 DIAGNOSIS — K209 Esophagitis, unspecified without bleeding: Secondary | ICD-10-CM

## 2020-10-19 DIAGNOSIS — K2970 Gastritis, unspecified, without bleeding: Secondary | ICD-10-CM | POA: Diagnosis not present

## 2020-10-19 DIAGNOSIS — R0789 Other chest pain: Secondary | ICD-10-CM

## 2020-10-19 DIAGNOSIS — K2100 Gastro-esophageal reflux disease with esophagitis, without bleeding: Secondary | ICD-10-CM | POA: Diagnosis not present

## 2020-10-19 MED ORDER — PANTOPRAZOLE SODIUM 40 MG PO TBEC: 40.0000 mg | DELAYED_RELEASE_TABLET | Freq: Every day | ORAL | 6 refills | Status: DC

## 2020-10-19 MED ORDER — FAMOTIDINE 20 MG PO TABS
20.0000 mg | ORAL_TABLET | Freq: Every day | ORAL | 3 refills | Status: DC
Start: 2020-10-19 — End: 2021-02-06

## 2020-10-19 NOTE — Progress Notes (Signed)
Breanna Montes    [id number]    07-28-1933  Primary Care Physician:Dennis, Juel Burrow, DO  Referring Physician: Kathe Becton, DO [place],  [place] [address]   Chief complaint:GERD  HPI:  87 yr very pleasant female here for follow-up for GERD. Overall she is doing better on pantoprazole daily. Denies any nausea, vomiting, abdominal pain, melena or bright red blood per rectum  EGD 07/25/20 - LA Grade C (one or more mucosal breaks continuous between tops of 2 or more mucosal folds, less than 75% circumference) esophagitis with no bleeding was found 34 to 36 cm from the incisors. No stricture or stenosis. Exam otherwise normal for dysphagia - A 4 cm hiatal hernia was present. - Patchy mild inflammation characterized by congestion (edema), erosions and erythema was found in the gastric antrum and in the prepyloric region of the stomach. Biopsies were taken with a cold forceps for Helicobacter pylori testing. - The examined duodenum was normal.     Outpatient Encounter Medications as of 10/19/2020  Medication Sig  . albuterol (VENTOLIN HFA) 108 (90 Base) MCG/ACT inhaler Inhale 2 puffs into the lungs every 6 (six) hours as needed.  . Calcium Carb-Cholecalciferol (CALCIUM + D3 PO) Take 2 tablets by mouth daily.  . Cholecalciferol (VITAMIN D3) 125 MCG (5000 UT) CAPS Take 1 capsule by mouth 2 (two) times a week.  . denosumab (PROLIA) 60 MG/ML SOSY injection Inject 60 mg into the skin every 6 (six) months.  Mariane Baumgarten Calcium (STOOL SOFTENER PO) Take 1 tablet by mouth daily.  . famotidine (PEPCID) 20 MG tablet Take 1 tablet by mouth in the morning and at bedtime.  Javier Docker Oil 350 MG CAPS Take 1 capsule by mouth daily.  . Multiple Vitamin (MULTIVITAMIN) tablet Take 1 tablet by mouth daily.  . Multiple Vitamins-Minerals (EMERGEN-C IMMUNE) PACK Take by mouth.  . pantoprazole (PROTONIX) 40 MG tablet Take 1 tablet (40 mg total) by mouth daily.  . polyethylene  glycol (MIRALAX / GLYCOLAX) 17 g packet Take 17 g by mouth daily.  . sucralfate (CARAFATE) 1 g tablet TAKE 1 TABLET BY MOUTH 4 TIMES DAILY WITH MEALS AND AT BEDTIME  . [DISCONTINUED] AMBULATORY NON FORMULARY MEDICATION Organic Raw Apple Cider Vinegar daily (Patient not taking: Reported on 10/19/2020)  . [DISCONTINUED] famotidine (PEPCID) 20 MG tablet Take 1 tablet (20 mg total) by mouth at bedtime. (Patient not taking: Reported on 10/19/2020)  . [DISCONTINUED] Famotidine-Ca Carb-Mag Hydrox (PEPCID COMPLETE PO) Take by mouth as needed. (Patient not taking: Reported on 10/19/2020)  . [DISCONTINUED] pantoprazole (PROTONIX) 40 MG tablet TAKE 1 TABLET BY MOUTH TWICE DAILY FOR  2  MONTHS  THEN  SWITCH  TO  TAKING  ONCE  DAILY (Patient not taking: Reported on 10/19/2020)   Facility-Administered Encounter Medications as of 10/19/2020  Medication  . 0.9 %  sodium chloride infusion    Allergies as of 10/19/2020 - Review Complete 10/19/2020  Allergen Reaction Noted  . Codeine Nausea And Vomiting 07/31/2017  . Penicillins Rash 07/31/2017    Past Medical History:  Diagnosis Date  . Arthritis   . GERD (gastroesophageal reflux disease)   . History of hiatal hernia   . Osteoporosis   . PONV (postoperative nausea and vomiting)     Past Surgical History:  Procedure Laterality Date  . APPENDECTOMY    . BUNIONECTOMY     left    2005  . CATARACT EXTRACTION W/  INTRAOCULAR LENS  IMPLANT, BILATERAL     2009  . CERVICAL CONE BIOPSY    . cervical cryosurgery     90-95  . CHOLECYSTECTOMY     2001  . ERCP     2001  internal bleed  . FOOT SURGERY     rt  [phone]  . HEMORRHOID SURGERY     63+66  . HERNIA REPAIR     partial hip repair2017     right  . TOTAL KNEE ARTHROPLASTY Right 08/18/2017   Procedure: RIGHT TOTAL KNEE ARTHROPLASTY;  Surgeon: Vickey Huger, MD;  Location: Day;  Service: Orthopedics;  Laterality: Right;  . TUBAL LIGATION     86    Family History  Problem Relation Age of Onset  .  Emphysema Mother   . Stomach cancer Father   . Prostate cancer Sister   . Colon cancer Neg Hx   . Esophageal cancer Neg Hx   . Pancreatic cancer Neg Hx     Social History   Socioeconomic History  . Marital status: Single    Spouse name: Not on file  . Number of children: Not on file  . Years of education: Not on file  . Highest education level: Not on file  Occupational History  . Not on file  Tobacco Use  . Smoking status: Former Smoker    Types: Cigarettes  . Smokeless tobacco: Never Used  Vaping Use  . Vaping Use: Never used  Substance and Sexual Activity  . Alcohol use: No  . Drug use: No  . Sexual activity: Not on file  Other Topics Concern  . Not on file  Social History Narrative  . Not on file   Social Determinants of Health   Financial Resource Strain: Not on file  Food Insecurity: Not on file  Transportation Needs: Not on file  Physical Activity: Not on file  Stress: Not on file  Social Connections: Not on file  Intimate Partner Violence: Not on file      Review of systems: All other review of systems negative except as mentioned in the HPI.   Physical Exam: Vitals:   10/19/20 0918  BP: 118/78  Pulse: 78   Body mass index is 24.05 kg/m. Gen:      No acute distress Neuro: alert and oriented x 3 Psych: normal mood and affect  Data Reviewed:  Reviewed labs, radiology imaging, old records and pertinent past GI work up   Assessment and Plan/Recommendations:  87 year old very pleasant female with history of chronic GERD and cough Overall reflux symptoms have significantly improved with daily pantoprazole before breakfast and Pepcid at bedtime as needed EGD showed hiatal hernia and erosive esophagitis Continue antireflux measures  She continues to have intermittent cough.  Her previous pulmonologist is no longer in the area.  Will refer to [facility] pulmonary for further evaluation of chronic cough  Return in 1 year or sooner if  needed  This visit required 30 minutes of patient care (this includes precharting, chart review, review of results, face-to-face time used for counseling as well as treatment plan and follow-up. The patient was provided an opportunity to ask questions and all were answered. The patient agreed with the plan and demonstrated an understanding of the instructions.  Damaris Hippo , MD    CC: Kathe Becton, DO

## 2020-10-19 NOTE — Patient Instructions (Signed)
We will refer you to Pulmonary and we will contact you with your appointment  We have sent Pantoprazole and Pepcid to your pharmacy  Follow up for 1 year   [employer]'s Current Therapy 2021 (pp. 213-216). [place], [place]: Elsevier.">  Gastroesophageal Reflux Disease, Adult Gastroesophageal reflux (GER) happens when acid from the stomach flows up into the tube that connects the mouth and the stomach (esophagus). Normally, food travels down the esophagus and stays in the stomach to be digested. However, when a person has GER, food and stomach acid sometimes move back up into the esophagus. If this becomes a more serious problem, the person may be diagnosed with a disease called gastroesophageal reflux disease (GERD). GERD occurs when the reflux:  Happens often.  Causes frequent or severe symptoms.  Causes problems such as damage to the esophagus. When stomach acid comes in contact with the esophagus, the acid may cause inflammation in the esophagus. Over time, GERD may create small holes (ulcers) in the lining of the esophagus. What are the causes? This condition is caused by a problem with the muscle between the esophagus and the stomach (lower esophageal sphincter, or LES). Normally, the LES muscle closes after food passes through the esophagus to the stomach. When the LES is weakened or abnormal, it does not close properly, and that allows food and stomach acid to go back up into the esophagus. The LES can be weakened by certain dietary substances, medicines, and medical conditions, including:  Tobacco use.  Pregnancy.  Having a hiatal hernia.  Alcohol use.  Certain foods and beverages, such as coffee, chocolate, onions, and peppermint. What increases the risk? You are more likely to develop this condition if you:  Have an increased body weight.  Have a connective tissue disorder.  Take NSAIDs, such as ibuprofen. What are the signs or symptoms? Symptoms of this condition  include:  Heartburn.  Difficult or painful swallowing and the feeling of having a lump in the throat.  A bitter taste in the mouth.  Bad breath and having a large amount of saliva.  Having an upset or bloated stomach and belching.  Chest pain. Different conditions can cause chest pain. Make sure you see your health care provider if you experience chest pain.  Shortness of breath or wheezing.  Ongoing (chronic) cough or a nighttime cough.  Wearing away of tooth enamel.  Weight loss. How is this diagnosed? This condition may be diagnosed based on a medical history and a physical exam. To determine if you have mild or severe GERD, your health care provider may also monitor how you respond to treatment. You may also have tests, including:  A test to examine your stomach and esophagus with a small camera (endoscopy).  A test that measures the acidity level in your esophagus.  A test that measures how much pressure is on your esophagus.  A barium swallow or modified barium swallow test to show the shape, size, and functioning of your esophagus. How is this treated? Treatment for this condition may vary depending on how severe your symptoms are. Your health care provider may recommend:  Changes to your diet.  Medicine.  Surgery. The goal of treatment is to help relieve your symptoms and to prevent complications. Follow these instructions at home: Eating and drinking  Follow a diet as recommended by your health care provider. This may involve avoiding foods and drinks such as: ? Coffee and tea, with or without caffeine. ? Drinks that contain alcohol. ? Energy drinks  and sports drinks. ? Carbonated drinks or sodas. ? Chocolate and cocoa. ? Peppermint and mint flavorings. ? Garlic and onions. ? Horseradish. ? Spicy and acidic foods, including peppers, chili powder, curry powder, vinegar, hot sauces, and barbecue sauce. ? Citrus fruit juices and citrus fruits, such as  oranges, lemons, and limes. ? Tomato-based foods, such as red sauce, chili, salsa, and pizza with red sauce. ? Fried and fatty foods, such as donuts, french fries, potato chips, and high-fat dressings. ? High-fat meats, such as hot dogs and fatty cuts of red and white meats, such as rib eye steak, sausage, ham, and bacon. ? High-fat dairy items, such as whole milk, butter, and cream cheese.  Eat small, frequent meals instead of large meals.  Avoid drinking large amounts of liquid with your meals.  Avoid eating meals during the 2-3 hours before bedtime.  Avoid lying down right after you eat.  Do not exercise right after you eat.   Lifestyle  Do not use any products that contain nicotine or tobacco. These products include cigarettes, chewing tobacco, and vaping devices, such as e-cigarettes. If you need help quitting, ask your health care provider.  Try to reduce your stress by using methods such as yoga or meditation. If you need help reducing stress, ask your health care provider.  If you are overweight, reduce your weight to an amount that is healthy for you. Ask your health care provider for guidance about a safe weight loss goal.   General instructions  Pay attention to any changes in your symptoms.  Take over-the-counter and prescription medicines only as told by your health care provider. Do not take aspirin, ibuprofen, or other NSAIDs unless your health care provider told you to take these medicines.  Wear loose-fitting clothing. Do not wear anything tight around your waist that causes pressure on your abdomen.  Raise (elevate) the head of your bed about 6 inches (15 cm). You can use a wedge to do this.  Avoid bending over if this makes your symptoms worse.  Keep all follow-up visits. This is important. Contact a health care provider if:  You have: ? New symptoms. ? Unexplained weight loss. ? Difficulty swallowing or it hurts to swallow. ? Wheezing or a persistent  cough. ? A hoarse voice.  Your symptoms do not improve with treatment. Get help right away if:  You have sudden pain in your arms, neck, jaw, teeth, or back.  You suddenly feel sweaty, dizzy, or light-headed.  You have chest pain or shortness of breath.  You vomit and the vomit is green, yellow, or black, or it looks like blood or coffee grounds.  You faint.  You have stool that is red, bloody, or black.  You cannot swallow, drink, or eat. These symptoms may represent a serious problem that is an emergency. Do not wait to see if the symptoms will go away. Get medical help right away. Call your local emergency services (911 in the [place] Do not drive yourself to the hospital. Summary  Gastroesophageal reflux happens when acid from the stomach flows up into the esophagus. GERD is a disease in which the reflux happens often, causes frequent or severe symptoms, or causes problems such as damage to the esophagus.  Treatment for this condition may vary depending on how severe your symptoms are. Your health care provider may recommend diet and lifestyle changes, medicine, or surgery.  Contact a health care provider if you have new or worsening symptoms.  Take over-the-counter and  prescription medicines only as told by your health care provider. Do not take aspirin, ibuprofen, or other NSAIDs unless your health care provider told you to do so.  Keep all follow-up visits as told by your health care provider. This is important. This information is not intended to replace advice given to you by your health care provider. Make sure you discuss any questions you have with your health care provider. Document Revised: 12/13/2019 Document Reviewed: 12/13/2019 Elsevier Patient Education  2021 [employer].  Due to recent changes in healthcare laws, you may see the results of your imaging and laboratory studies on MyChart before your provider has had a chance to review them.  We understand that in  some cases there may be results that are confusing or concerning to you. Not all laboratory results come back in the same time frame and the provider may be waiting for multiple results in order to interpret others.  Please give [place] 48 hours in order for your provider to thoroughly review all the results before contacting the office for clarification of your results.   Thank you for choosing [facility]  Karleen Hampshire [place],MD

## 2020-10-30 ENCOUNTER — Encounter: Payer: Self-pay | Admitting: Gastroenterology

## 2020-11-02 DIAGNOSIS — L814 Other melanin hyperpigmentation: Secondary | ICD-10-CM | POA: Diagnosis not present

## 2020-11-02 DIAGNOSIS — L821 Other seborrheic keratosis: Secondary | ICD-10-CM | POA: Diagnosis not present

## 2020-11-02 DIAGNOSIS — D2261 Melanocytic nevi of right upper limb, including shoulder: Secondary | ICD-10-CM | POA: Diagnosis not present

## 2020-11-02 DIAGNOSIS — D1809 Hemangioma of other sites: Secondary | ICD-10-CM | POA: Diagnosis not present

## 2020-11-02 DIAGNOSIS — D225 Melanocytic nevi of trunk: Secondary | ICD-10-CM | POA: Diagnosis not present

## 2020-11-02 DIAGNOSIS — D2271 Melanocytic nevi of right lower limb, including hip: Secondary | ICD-10-CM | POA: Diagnosis not present

## 2020-11-02 DIAGNOSIS — L298 Other pruritus: Secondary | ICD-10-CM | POA: Diagnosis not present

## 2020-11-02 DIAGNOSIS — L720 Epidermal cyst: Secondary | ICD-10-CM | POA: Diagnosis not present

## 2020-11-02 DIAGNOSIS — D485 Neoplasm of uncertain behavior of skin: Secondary | ICD-10-CM | POA: Diagnosis not present

## 2020-11-02 DIAGNOSIS — L570 Actinic keratosis: Secondary | ICD-10-CM | POA: Diagnosis not present

## 2020-11-02 DIAGNOSIS — D1801 Hemangioma of skin and subcutaneous tissue: Secondary | ICD-10-CM | POA: Diagnosis not present

## 2020-11-05 ENCOUNTER — Other Ambulatory Visit: Payer: Self-pay | Admitting: Gastroenterology

## 2020-11-15 ENCOUNTER — Telehealth: Payer: Self-pay | Admitting: Gastroenterology

## 2020-11-15 DIAGNOSIS — K219 Gastro-esophageal reflux disease without esophagitis: Secondary | ICD-10-CM | POA: Diagnosis not present

## 2020-11-15 DIAGNOSIS — D1800 Hemangioma unspecified site: Secondary | ICD-10-CM | POA: Diagnosis not present

## 2020-11-15 DIAGNOSIS — L659 Nonscarring hair loss, unspecified: Secondary | ICD-10-CM | POA: Diagnosis not present

## 2020-11-15 DIAGNOSIS — E559 Vitamin D deficiency, unspecified: Secondary | ICD-10-CM | POA: Diagnosis not present

## 2020-11-15 DIAGNOSIS — L299 Pruritus, unspecified: Secondary | ICD-10-CM | POA: Diagnosis not present

## 2020-11-15 DIAGNOSIS — R4189 Other symptoms and signs involving cognitive functions and awareness: Secondary | ICD-10-CM | POA: Diagnosis not present

## 2020-11-16 NOTE — Telephone Encounter (Signed)
Spoke with the patient. Scheduled her for the first available appointment on 12/14/20. She reports constipation with abdominal discomfort and itching. She feels the famotidine and the pantoprazole may be responsible. She would like to go back on Omeprazole. She was not getting relief from her constipation with miralax and her other stool softener. After 3 days of no bowel movement she took generic Ex-lax. She had results from this in 24 hours. She is not on Carafate at all.

## 2020-11-16 NOTE — Telephone Encounter (Signed)
Ok to go back on Omeprazole and stop pepcid/pantoprazole. Thanks

## 2020-11-16 NOTE — Telephone Encounter (Signed)
Spoke with the patient. She agrees to this plan. She will call with an update of her progress in a couple of weeks.

## 2020-11-20 DIAGNOSIS — E538 Deficiency of other specified B group vitamins: Secondary | ICD-10-CM | POA: Diagnosis not present

## 2020-11-27 DIAGNOSIS — E538 Deficiency of other specified B group vitamins: Secondary | ICD-10-CM | POA: Diagnosis not present

## 2020-12-14 ENCOUNTER — Ambulatory Visit: Payer: PPO | Admitting: Gastroenterology

## 2020-12-20 DIAGNOSIS — M810 Age-related osteoporosis without current pathological fracture: Secondary | ICD-10-CM | POA: Diagnosis not present

## 2021-02-02 DIAGNOSIS — H903 Sensorineural hearing loss, bilateral: Secondary | ICD-10-CM | POA: Diagnosis not present

## 2021-02-06 ENCOUNTER — Encounter: Payer: Self-pay | Admitting: Pulmonary Disease

## 2021-02-06 ENCOUNTER — Ambulatory Visit (INDEPENDENT_AMBULATORY_CARE_PROVIDER_SITE_OTHER): Payer: PPO | Admitting: Pulmonary Disease

## 2021-02-06 ENCOUNTER — Other Ambulatory Visit: Payer: Self-pay

## 2021-02-06 VITALS — BP 126/82 | HR 84 | Ht 62.0 in | Wt 128.4 lb

## 2021-02-06 DIAGNOSIS — R059 Cough, unspecified: Secondary | ICD-10-CM | POA: Diagnosis not present

## 2021-02-06 DIAGNOSIS — R0982 Postnasal drip: Secondary | ICD-10-CM | POA: Diagnosis not present

## 2021-02-06 MED ORDER — IPRATROPIUM BROMIDE 0.03 % NA SOLN: 2.0000 | Freq: Two times a day (BID) | NASAL | 12 refills | Status: DC

## 2021-02-06 MED ORDER — FLUTICASONE PROPIONATE 50 MCG/ACT NA SUSP
1.0000 | Freq: Every day | NASAL | 2 refills | Status: DC
Start: 2021-02-06 — End: 2022-07-22

## 2021-02-06 NOTE — Progress Notes (Addendum)
Synopsis: Referred in August 2022 for cough by [name], MD  Subjective:   PATIENT ID: Breanna Montes GENDER: female DOB: [birth year], MRN: [id number]   HPI  Chief Complaint  Patient presents with   Consult    Referred by GI doctor for chronic cough over the past 8-10 years. Described as a dry, hacking cough. Tested positive for COVID on 01/14/21. Increased SOB since then.     Breanna Montes is an 87 year old woman, former smoker with GERD and hiatal hernia who is referred to pulmonary clinic for chronic cough.   She reports having cough over the past 10 years.  The cough is primarily dry hacking cough with occasional sputum production.  She does report spontaneous coughing episodes that can be severe enough to cause emesis.  She recently had EGD which showed 4 cm hiatal hernia and she is on PPI therapy for reflux disease.  She is sleeping with her head elevated at night or sometimes in a recliner.  She is eating hours before bedtime.  She complains of sinus congestion and postnasal drip.  She is using saline nasal spray as needed.  She denies any significant issues with seasonal allergies.  She has had sinus surgery in the past and has been seen by Dr. Constance Holster of ENT.  She recently had COVID at the end of July and continues to complain of fatigue.  The cough does not seem to keep her up at night.  She is a former smoker and quit in the 1970s.  She was exposed to secondhand smoke as both her parents smoked around her when she was growing up.  She denies any significant dust or chemical exposures throughout her work history as she worked in [employer] for 18 years.  She currently has an albuterol inhaler which does help with the cough occasionally.  She does have occasional wheezing since she had her COVID-19 infection but denies wheezing previously.  She had tried [facility] inhaler therapy but this was stopped due to concerns for throat irritation.  Past Medical History:  Diagnosis Date    Arthritis    GERD (gastroesophageal reflux disease)    History of hiatal hernia    Osteoporosis    PONV (postoperative nausea and vomiting)      Family History  Problem Relation Age of Onset   Emphysema Mother    Stomach cancer Father    Prostate cancer Sister    Colon cancer Neg Hx    Esophageal cancer Neg Hx    Pancreatic cancer Neg Hx      Social History   Socioeconomic History   Marital status: Single    Spouse name: Not on file   Number of children: Not on file   Years of education: Not on file   Highest education level: Not on file  Occupational History   Not on file  Tobacco Use   Smoking status: Former    Types: Cigarettes   Smokeless tobacco: Never  Vaping Use   Vaping Use: Never used  Substance and Sexual Activity   Alcohol use: No   Drug use: No   Sexual activity: Not on file  Other Topics Concern   Not on file  Social History Narrative   Not on file   Social Determinants of Health   Financial Resource Strain: Not on file  Food Insecurity: Not on file  Transportation Needs: Not on file  Physical Activity: Not on file  Stress: Not on file  Social  Connections: Not on file  Intimate Partner Violence: Not on file     Allergies  Allergen Reactions   Codeine Nausea And Vomiting   Penicillins Rash    Has patient had a PCN reaction causing immediate rash, facial/tongue/throat swelling, SOB or lightheadedness with hypotension: Unknown Has patient had a PCN reaction causing severe rash involving mucus membranes or skin necrosis: Unknown Has patient had a PCN reaction that required hospitalization: No Has patient had a PCN reaction occurring within the last 10 years: No If all of the above answers are "NO", then may proceed with Cephalosporin use.      Outpatient Medications Prior to Visit  Medication Sig Dispense Refill   albuterol (VENTOLIN HFA) 108 (90 Base) MCG/ACT inhaler Inhale 2 puffs into the lungs every 6 (six) hours as needed.      Calcium Carb-Cholecalciferol (CALCIUM + D3 PO) Take 2 tablets by mouth daily.     Cholecalciferol (VITAMIN D3) 125 MCG (5000 UT) CAPS Take 1 capsule by mouth 2 (two) times a week.     denosumab (PROLIA) 60 MG/ML SOSY injection Inject 60 mg into the skin every 6 (six) months.     Docusate Calcium (STOOL SOFTENER PO) Take 1 tablet by mouth daily.     Krill Oil 350 MG CAPS Take 1 capsule by mouth daily.     Multiple Vitamin (MULTIVITAMIN) tablet Take 1 tablet by mouth daily.     Multiple Vitamins-Minerals (EMERGEN-C IMMUNE) PACK Take by mouth.     polyethylene glycol (MIRALAX / GLYCOLAX) 17 g packet Take 17 g by mouth daily.     famotidine (PEPCID) 20 MG tablet Take 1 tablet (20 mg total) by mouth at bedtime. 30 tablet 3   pantoprazole (PROTONIX) 40 MG tablet Take 1 tablet (40 mg total) by mouth daily. 30 tablet 6   sucralfate (CARAFATE) 1 g tablet TAKE 1 TABLET BY MOUTH 4 TIMES DAILY, WITH MEALS AND AT BEDTIME 40 tablet 0   Facility-Administered Medications Prior to Visit  Medication Dose Route Frequency Provider Last Rate Last Admin   0.9 %  sodium chloride infusion  500 mL Intravenous Once Nandigam, Venia Minks, MD        Review of Systems  Constitutional:  Negative for chills, fever, malaise/fatigue and weight loss.  HENT:  Positive for congestion. Negative for sinus pain and sore throat.   Eyes: Negative.   Respiratory:  Positive for cough and wheezing. Negative for hemoptysis, sputum production and shortness of breath.   Cardiovascular:  Negative for chest pain, palpitations, orthopnea, claudication and leg swelling.  Gastrointestinal:  Negative for abdominal pain, heartburn, nausea and vomiting.  Genitourinary: Negative.   Musculoskeletal:  Negative for joint pain and myalgias.  Skin:  Negative for rash.  Neurological:  Negative for weakness.  Endo/Heme/Allergies: Negative.   Psychiatric/Behavioral: Negative.       Objective:   Vitals:   02/06/21 1333  BP: 126/82  Pulse: 84   SpO2: 99%  Weight: 58.2 kg  Height: '5\' 2"'$  (1.575 m)     Physical Exam Constitutional:      General: She is not in acute distress.    Appearance: She is not ill-appearing.  HENT:     Head: Normocephalic and atraumatic.     Nose: Nose normal.     Mouth/Throat:     Mouth: Mucous membranes are moist.     Pharynx: Oropharynx is clear.  Eyes:     General: No scleral icterus.    Conjunctiva/sclera: Conjunctivae normal.  Pupils: Pupils are equal, round, and reactive to light.  Cardiovascular:     Rate and Rhythm: Normal rate and regular rhythm.     Pulses: Normal pulses.     Heart sounds: Normal heart sounds. No murmur heard. Pulmonary:     Effort: Pulmonary effort is normal.     Breath sounds: Normal breath sounds. No wheezing, rhonchi or rales.  Abdominal:     General: Bowel sounds are normal.     Palpations: Abdomen is soft.  Musculoskeletal:     Right lower leg: No edema.     Left lower leg: No edema.  Lymphadenopathy:     Cervical: No cervical adenopathy.  Skin:    General: Skin is warm and dry.  Neurological:     General: No focal deficit present.     Mental Status: She is alert.  Psychiatric:        Mood and Affect: Mood normal.        Behavior: Behavior normal.        Thought Content: Thought content normal.        Judgment: Judgment normal.      CBC    Component Value Date/Time   WBC 12.2 (H) 08/20/2017 0847   RBC 3.93 08/20/2017 0847   HGB 12.2 08/20/2017 0847   HCT 36.2 08/20/2017 0847   PLT 210 08/20/2017 0847   MCV 92.1 08/20/2017 0847   MCH 31.0 08/20/2017 0847   MCHC 33.7 08/20/2017 0847   RDW 12.5 08/20/2017 0847   LYMPHSABS 2.0 08/08/2017 1414   MONOABS 0.5 08/08/2017 1414   EOSABS 0.2 08/08/2017 1414   BASOSABS 0.0 08/08/2017 1414   BMP Latest Ref Rng & Units 08/19/2017 08/08/2017  Glucose 65 - 99 mg/dL 114(H) 90  BUN 6 - 20 mg/dL 15 14  Creatinine 0.44 - 1.00 mg/dL 0.76 0.81  Sodium 135 - 145 mmol/L 137 140  Potassium 3.5 - 5.1  mmol/L 4.3 4.2  Chloride 101 - 111 mmol/L 103 106  CO2 22 - 32 mmol/L 25 22  Calcium 8.9 - 10.3 mg/dL 8.8(L) 9.3   Chest imaging: CXR 03/10/19 The heart size and mediastinal contours are within normal limits.  Both lungs are clear. The visualized skeletal structures are  unremarkable.   PFT: No flowsheet data found.    Assessment & Plan:   Cough - Plan: Pulmonary Function Test, CT Chest High Resolution  Post-nasal drip - Plan: fluticasone (FLONASE) 50 MCG/ACT nasal spray, ipratropium (ATROVENT) 0.03 % nasal spray  Discussion: Breanna Montes is an 87 year old woman, former smoker with GERD and hiatal hernia who is referred to pulmonary clinic for chronic cough.   She has chronic cough in the setting of hiatal hernia with GERD and sinus congestion with postnasal drainage.  Her GERD is currently well managed with PPI therapy, eating hours before bedtime and sleeping with her head elevated.  She is to start ipratropium nasal spray 2 sprays per nostril twice daily and fluticasone nasal spray 2 sprays per nostril daily for her sinus congestion and postnasal drainage.  We will check pulmonary function tests and a high-resolution CT chest to rule out other pulmonary etiologies to chronic cough.  Follow-up in 2 months.  Freda Jackson, MD [facility] Office: [phone]   Current Outpatient Medications:    albuterol (VENTOLIN HFA) 108 (90 Base) MCG/ACT inhaler, Inhale 2 puffs into the lungs every 6 (six) hours as needed., Disp: , Rfl:    Calcium Carb-Cholecalciferol (CALCIUM + D3  PO), Take 2 tablets by mouth daily., Disp: , Rfl:    Cholecalciferol (VITAMIN D3) 125 MCG (5000 UT) CAPS, Take 1 capsule by mouth 2 (two) times a week., Disp: , Rfl:    denosumab (PROLIA) 60 MG/ML SOSY injection, Inject 60 mg into the skin every 6 (six) months., Disp: , Rfl:    Docusate Calcium (STOOL SOFTENER PO), Take 1 tablet by mouth daily., Disp: , Rfl:    fluticasone (FLONASE) 50  MCG/ACT nasal spray, Place 1 spray into both nostrils daily., Disp: 16 g, Rfl: 2   ipratropium (ATROVENT) 0.03 % nasal spray, Place 2 sprays into both nostrils every 12 (twelve) hours., Disp: 30 mL, Rfl: 12   Krill Oil 350 MG CAPS, Take 1 capsule by mouth daily., Disp: , Rfl:    Multiple Vitamin (MULTIVITAMIN) tablet, Take 1 tablet by mouth daily., Disp: , Rfl:    Multiple Vitamins-Minerals (EMERGEN-C IMMUNE) PACK, Take by mouth., Disp: , Rfl:    polyethylene glycol (MIRALAX / GLYCOLAX) 17 g packet, Take 17 g by mouth daily., Disp: , Rfl:   Current Facility-Administered Medications:    0.9 %  sodium chloride infusion, 500 mL, Intravenous, Once, Nandigam, Venia Minks, MD

## 2021-02-06 NOTE — Patient Instructions (Signed)
Start fluticasone nasal spray, 2 sprays per nostril daily  Start ipratropium nasal spray, 2 sprays per nostril twice daily  Continue to use albuterol as needed for cough or wheezing.  Continue to adhere to your treatment plan for GERD.   We will schedule you for a CT chest scan and pulmonary function tests at your convenience.

## 2021-02-09 ENCOUNTER — Other Ambulatory Visit: Payer: Self-pay

## 2021-02-09 ENCOUNTER — Ambulatory Visit (INDEPENDENT_AMBULATORY_CARE_PROVIDER_SITE_OTHER)
Admission: RE | Admit: 2021-02-09 | Discharge: 2021-02-09 | Disposition: A | Discharge status: Home / Self Care | Payer: PPO | Source: Ambulatory Visit | Attending: Pulmonary Disease | Admitting: Pulmonary Disease

## 2021-02-09 DIAGNOSIS — R059 Cough, unspecified: Secondary | ICD-10-CM

## 2021-02-09 DIAGNOSIS — I700 Atherosclerosis of aorta: Secondary | ICD-10-CM | POA: Diagnosis not present

## 2021-02-09 DIAGNOSIS — J849 Interstitial pulmonary disease, unspecified: Secondary | ICD-10-CM | POA: Diagnosis not present

## 2021-02-09 DIAGNOSIS — J189 Pneumonia, unspecified organism: Secondary | ICD-10-CM | POA: Diagnosis not present

## 2021-02-20 ENCOUNTER — Ambulatory Visit (INDEPENDENT_AMBULATORY_CARE_PROVIDER_SITE_OTHER): Payer: PPO | Admitting: Pulmonary Disease

## 2021-02-20 ENCOUNTER — Other Ambulatory Visit: Payer: Self-pay

## 2021-02-20 DIAGNOSIS — R059 Cough, unspecified: Secondary | ICD-10-CM

## 2021-02-20 LAB — PULMONARY FUNCTION TEST
DL/VA % pred: 82 %
DL/VA: 3.4 ml/min/mmHg/L
DLCO cor % pred: 68 %
DLCO cor: 11.82 ml/min/mmHg
DLCO unc % pred: 68 %
DLCO unc: 11.9 ml/min/mmHg
FEF 25-75 Post: 1.61 L/sec
FEF 25-75 Pre: 1.42 L/sec
FEF2575-%Change-Post: 13 %
FEF2575-%Pred-Post: 155 %
FEF2575-%Pred-Pre: 137 %
FEV1-%Change-Post: 3 %
FEV1-%Pred-Post: 113 %
FEV1-%Pred-Pre: 109 %
FEV1-Post: 1.78 L
FEV1-Pre: 1.73 L
FEV1FVC-%Change-Post: 2 %
FEV1FVC-%Pred-Pre: 104 %
FEV6-%Change-Post: 0 %
FEV6-%Pred-Post: 114 %
FEV6-%Pred-Pre: 112 %
FEV6-Post: 2.29 L
FEV6-Pre: 2.27 L
FEV6FVC-%Pred-Post: 106 %
FEV6FVC-%Pred-Pre: 106 %
FVC-%Change-Post: 0 %
FVC-%Pred-Post: 106 %
FVC-%Pred-Pre: 105 %
FVC-Post: 2.29 L
FVC-Pre: 2.27 L
Post FEV1/FVC ratio: 78 %
Post FEV6/FVC ratio: 100 %
Pre FEV1/FVC ratio: 76 %
Pre FEV6/FVC Ratio: 100 %
RV % pred: 86 %
RV: 2.07 L
TLC % pred: 90 %
TLC: 4.31 L

## 2021-02-20 NOTE — Progress Notes (Signed)
PFT done today. 

## 2021-03-15 DIAGNOSIS — H903 Sensorineural hearing loss, bilateral: Secondary | ICD-10-CM | POA: Diagnosis not present

## 2021-04-03 DIAGNOSIS — H6123 Impacted cerumen, bilateral: Secondary | ICD-10-CM | POA: Diagnosis not present

## 2021-04-05 ENCOUNTER — Encounter: Payer: Self-pay | Admitting: Pulmonary Disease

## 2021-04-05 ENCOUNTER — Ambulatory Visit (INDEPENDENT_AMBULATORY_CARE_PROVIDER_SITE_OTHER): Payer: PPO | Admitting: Pulmonary Disease

## 2021-04-05 ENCOUNTER — Other Ambulatory Visit: Payer: Self-pay

## 2021-04-05 VITALS — BP 124/70 | HR 82 | Ht 62.0 in | Wt 128.2 lb

## 2021-04-05 DIAGNOSIS — J849 Interstitial pulmonary disease, unspecified: Secondary | ICD-10-CM

## 2021-04-05 DIAGNOSIS — R0982 Postnasal drip: Secondary | ICD-10-CM

## 2021-04-05 DIAGNOSIS — R053 Chronic cough: Secondary | ICD-10-CM | POA: Diagnosis not present

## 2021-04-05 DIAGNOSIS — J3801 Paralysis of vocal cords and larynx, unilateral: Secondary | ICD-10-CM

## 2021-04-05 DIAGNOSIS — K219 Gastro-esophageal reflux disease without esophagitis: Secondary | ICD-10-CM

## 2021-04-05 DIAGNOSIS — K449 Diaphragmatic hernia without obstruction or gangrene: Secondary | ICD-10-CM

## 2021-04-05 MED ORDER — SPIRIVA RESPIMAT 2.5 MCG/ACT IN AERS: 2.0000 | INHALATION_SPRAY | Freq: Every day | RESPIRATORY_TRACT | 0 refills | Status: DC

## 2021-04-05 MED ORDER — ALBUTEROL SULFATE HFA 108 (90 BASE) MCG/ACT IN AERS: 1.0000 | INHALATION_SPRAY | Freq: Four times a day (QID) | RESPIRATORY_TRACT | 6 refills | Status: AC | PRN

## 2021-04-05 NOTE — Patient Instructions (Signed)
Start spiriva inhaler 2 puffs once daily  Use albuterol 1-2 puffs every 4-6 hours as needed for cough  Continue to use hard sugar free candies or cough drops as needed.   We can consider gabapentin therapy in the future or send you to our speech therapy team for further evaluation of the cough.

## 2021-04-05 NOTE — Progress Notes (Signed)
Synopsis: Referred in August 2022 for cough by [name], MD  Subjective:   PATIENT ID: Breanna Montes GENDER: female DOB: 28-Jul-1933, MRN: [id number]   +wheezing and shortness of breath. Sometimes the albuterol helps. Hasn't used it in a long time.    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 States the cough has been stable since last visit.     Breanna Montes is an 87 year old woman, former smoker with GERD and hiatal hernia who returns to pulmonary clinic for chronic cough.   She reports having cough over the past 10 years and also in childhood.  The cough is primarily dry hacking cough with occasional sputum production.  She does report spontaneous coughing episodes that can be severe enough to cause emesis.  She had EGD which showed 4 cm hiatal hernia and she is on PPI therapy for reflux disease.  She is sleeping with her head elevated at night or sometimes in a recliner.  She is eating hours before bedtime. The cough does not seem to keep her up at night. She does have intermittent wheezing.  She complains of sinus congestion and postnasal drip.  She is using saline nasal spray as needed.  She was started on ipratropium and fluticasone nasal sprays at last visit and developed epistaxis so stopped these medications.  She denies any significant issues with seasonal allergies.  She has had sinus surgery in the past and has been seen by Dr. Constance Holster of ENT. She was seen by Dr. 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 She was enrolled in speech therapy at the time with improvement in her cough symptoms at that time.  She recently had COVID at the end of July and continues to complain of fatigue.  She is a former smoker and quit in the  1970s.  She was exposed to secondhand smoke as both her parents smoked around her when she was growing up.  She denies any significant dust or chemical exposures throughout her work history as she worked in [employer] for 18 years.  She currently has an albuterol inhaler which does help with the cough occasionally.  She does have occasional wheezing since she had her COVID-19 infection but denies wheezing previously.  She had tried [facility] inhaler therapy but this was stopped due to concerns for throat irritation.  Pulmonary function tests 02/20/21 showed mild diffusion defect and HRCT chest on 02/09/21 showed mild peripheral ground-glass attenuation, septal thickening and subpleural reticulation and peripheral traction bronchiolectasis. No honeycombing noted.   Past Medical History:  Diagnosis Date   Arthritis    GERD (gastroesophageal reflux disease)    History of hiatal hernia    Osteoporosis    PONV (postoperative nausea and vomiting)      Family History  Problem Relation Age of Onset   Emphysema Mother    Stomach cancer Father    Prostate cancer Sister    Colon cancer Neg Hx    Esophageal cancer Neg Hx    Pancreatic cancer Neg Hx      Social History   Socioeconomic History   Marital status: Single    Spouse name: Not on file   Number of children: Not on file   Years of education: Not on  file   Highest education level: Not on file  Occupational History   Not on file  Tobacco Use   Smoking status: Former    Types: Cigarettes   Smokeless tobacco: Never  Vaping Use   Vaping Use: Never used  Substance and Sexual Activity   Alcohol use: No   Drug use: No   Sexual activity: Not on file  Other Topics Concern   Not on file  Social History Narrative   Not on file   Social Determinants of Health   Financial Resource Strain: Not on file  Food Insecurity: Not on file  Transportation Needs: Not on file  Physical Activity: Not on file  Stress: Not on file  Social  Connections: Not on file  Intimate Partner Violence: Not on file     Allergies  Allergen Reactions   Codeine Nausea And Vomiting   Penicillins Rash    Has patient had a PCN reaction causing immediate rash, facial/tongue/throat swelling, SOB or lightheadedness with hypotension: Unknown Has patient had a PCN reaction causing severe rash involving mucus membranes or skin necrosis: Unknown Has patient had a PCN reaction that required hospitalization: No Has patient had a PCN reaction occurring within the last 10 years: No If all of the above answers are "NO", then may proceed with Cephalosporin use.      Outpatient Medications Prior to Visit  Medication Sig Dispense Refill   Calcium Carb-Cholecalciferol (CALCIUM + D3 PO) Take 2 tablets by mouth daily.     Cholecalciferol (VITAMIN D3) 125 MCG (5000 UT) CAPS Take 1 capsule by mouth 2 (two) times a week.     denosumab (PROLIA) 60 MG/ML SOSY injection Inject 60 mg into the skin every 6 (six) months.     Docusate Calcium (STOOL SOFTENER PO) Take 1 tablet by mouth daily.     Krill Oil 350 MG CAPS Take 1 capsule by mouth daily.     Multiple Vitamin (MULTIVITAMIN) tablet Take 1 tablet by mouth daily.     Multiple Vitamins-Minerals (EMERGEN-C IMMUNE) PACK Take by mouth.     polyethylene glycol (MIRALAX / GLYCOLAX) 17 g packet Take 17 g by mouth daily.     albuterol (VENTOLIN HFA) 108 (90 Base) MCG/ACT inhaler Inhale 2 puffs into the lungs every 6 (six) hours as needed.     fluticasone (FLONASE) 50 MCG/ACT nasal spray Place 1 spray into both nostrils daily. (Patient not taking: Reported on 04/05/2021) 16 g 2   ipratropium (ATROVENT) 0.03 % nasal spray Place 2 sprays into both nostrils every 12 (twelve) hours. (Patient not taking: Reported on 04/05/2021) 30 mL 12   Facility-Administered Medications Prior to Visit  Medication Dose Route Frequency Provider Last Rate Last Admin   0.9 %  sodium chloride infusion  500 mL Intravenous Once Nandigam,  Venia Minks, MD        Review of Systems  Constitutional:  Negative for chills, fever, malaise/fatigue and weight loss.  HENT:  Positive for congestion. Negative for sinus pain and sore throat.   Eyes: Negative.   Respiratory:  Positive for cough, shortness of breath and wheezing. Negative for hemoptysis and sputum production.   Cardiovascular:  Negative for chest pain, palpitations, orthopnea, claudication and leg swelling.  Gastrointestinal:  Negative for abdominal pain, heartburn, nausea and vomiting.  Genitourinary: Negative.   Musculoskeletal:  Negative for joint pain and myalgias.  Skin:  Negative for rash.  Neurological:  Negative for weakness.  Endo/Heme/Allergies: Negative.   Psychiatric/Behavioral: Negative.  Objective:   Vitals:   04/05/21 1428  BP: 124/70  Pulse: 82  SpO2: 99%  Weight: 128 lb 3.2 oz (58.2 kg)  Height: 5\' 2"  (1.575 m)     Physical Exam Constitutional:      General: She is not in acute distress.    Appearance: She is not ill-appearing.  HENT:     Head: Normocephalic and atraumatic.     Nose: Nose normal.     Mouth/Throat:     Mouth: Mucous membranes are moist.     Pharynx: Oropharynx is clear.  Eyes:     General: No scleral icterus.    Conjunctiva/sclera: Conjunctivae normal.     Pupils: Pupils are equal, round, and reactive to light.  Cardiovascular:     Rate and Rhythm: Normal rate and regular rhythm.     Pulses: Normal pulses.     Heart sounds: Normal heart sounds. No murmur heard. Pulmonary:     Effort: Pulmonary effort is normal.     Breath sounds: Rales present. No wheezing or rhonchi.  Abdominal:     General: Bowel sounds are normal.     Palpations: Abdomen is soft.  Musculoskeletal:     Right lower leg: No edema.     Left lower leg: No edema.  Lymphadenopathy:     Cervical: No cervical adenopathy.  Skin:    General: Skin is warm and dry.  Neurological:     General: No focal deficit present.     Mental Status: She is  alert.  Psychiatric:        Mood and Affect: Mood normal.        Behavior: Behavior normal.        Thought Content: Thought content normal.        Judgment: Judgment normal.   CBC    Component Value Date/Time   WBC 12.2 (H) 08/20/2017 0847   RBC 3.93 08/20/2017 0847   HGB 12.2 08/20/2017 0847   HCT 36.2 08/20/2017 0847   PLT 210 08/20/2017 0847   MCV 92.1 08/20/2017 0847   MCH 31.0 08/20/2017 0847   MCHC 33.7 08/20/2017 0847   RDW 12.5 08/20/2017 0847   LYMPHSABS 2.0 08/08/2017 1414   MONOABS 0.5 08/08/2017 1414   EOSABS 0.2 08/08/2017 1414   BASOSABS 0.0 08/08/2017 1414   BMP Latest Ref Rng & Units 08/19/2017 08/08/2017  Glucose 65 - 99 mg/dL 114(H) 90  BUN 6 - 20 mg/dL 15 14  Creatinine 0.44 - 1.00 mg/dL 0.76 0.81  Sodium 135 - 145 mmol/L 137 140  Potassium 3.5 - 5.1 mmol/L 4.3 4.2  Chloride 101 - 111 mmol/L 103 106  CO2 22 - 32 mmol/L 25 22  Calcium 8.9 - 10.3 mg/dL 8.8(L) 9.3   Chest imaging: CXR 03/10/19 The heart size and mediastinal contours are within normal limits.  Both lungs are clear. The visualized skeletal structures are  unremarkable.   PFT: PFT Results Latest Ref Rng & Units 02/20/2021  FVC-Pre L 2.27  FVC-Predicted Pre % 105  FVC-Post L 2.29  FVC-Predicted Post % 106  Pre FEV1/FVC % % 76  Post FEV1/FCV % % 78  FEV1-Pre L 1.73  FEV1-Predicted Pre % 109  FEV1-Post L 1.78  DLCO uncorrected ml/min/mmHg 11.90  DLCO UNC% % 68  DLCO corrected ml/min/mmHg 11.82  DLCO COR %Predicted % 68  DLVA Predicted % 82  TLC L 4.31  TLC % Predicted % 90  RV % Predicted % 86      Assessment &  Plan:   Chronic cough - Plan: albuterol (VENTOLIN HFA) 108 (90 Base) MCG/ACT inhaler  Post-nasal drip  ILD (interstitial lung disease) (HCC)  Gastroesophageal reflux disease without esophagitis  Hiatal hernia  Paresis of right vocal cord  Discussion: Breanna Montes is an 87 year old woman, former smoker with GERD and hiatal hernia who returns to pulmonary clinic for  chronic cough.   There are multiple etiologies to her chronic cough which include GERD due to hiatal hernia, sinus congestion with post-nasal drainage, hx of paresis of the right vocal cord with neurogenic cough and probable pulmonary fibrosis based on her recent HRCT chest.   She is managing her GERD well with PPI, eating hours before bed time and sleeping with the head of her bed elevated.   For the post nasal drainage, she can use ipratropium nasal spray as needed and stop the fluticasone due to epistaxis.   We will start her on spiriva inhaler 2 puffs daily to see if this helps with her wheezing. She can use albuterol as needed as well.   For her history of right vocal cord paresis, we can consider ENT follow up for further evaluation or refer her to speech therapy for evaluation. We can also consider trial of gabapentin for neurogenic cough.   In regards to the new diagnosis of likely pulmonary fibrosis, we discussed symptom management for her cough as above. We discussed therapies for pulmonary fibrosis along with there side effect profiles and it is recommended that we wait and watch for any progression of disease.  Follow-up in 6 months.  Freda Jackson, MD [facility] Office: [phone]   Current Outpatient Medications:    Calcium Carb-Cholecalciferol (CALCIUM + D3 PO), Take 2 tablets by mouth daily., Disp: , Rfl:    Cholecalciferol (VITAMIN D3) 125 MCG (5000 UT) CAPS, Take 1 capsule by mouth 2 (two) times a week., Disp: , Rfl:    denosumab (PROLIA) 60 MG/ML SOSY injection, Inject 60 mg into the skin every 6 (six) months., Disp: , Rfl:    Docusate Calcium (STOOL SOFTENER PO), Take 1 tablet by mouth daily., Disp: , Rfl:    Krill Oil 350 MG CAPS, Take 1 capsule by mouth daily., Disp: , Rfl:    Multiple Vitamin (MULTIVITAMIN) tablet, Take 1 tablet by mouth daily., Disp: , Rfl:    Multiple Vitamins-Minerals (EMERGEN-C IMMUNE) PACK, Take by mouth., Disp: ,  Rfl:    polyethylene glycol (MIRALAX / GLYCOLAX) 17 g packet, Take 17 g by mouth daily., Disp: , Rfl:    Tiotropium Bromide Monohydrate (SPIRIVA RESPIMAT) 2.5 MCG/ACT AERS, Inhale 2 puffs into the lungs daily., Disp: 8 g, Rfl: 0   albuterol (VENTOLIN HFA) 108 (90 Base) MCG/ACT inhaler, Inhale 1-2 puffs into the lungs every 6 (six) hours as needed., Disp: 8 g, Rfl: 6   fluticasone (FLONASE) 50 MCG/ACT nasal spray, Place 1 spray into both nostrils daily. (Patient not taking: Reported on 04/05/2021), Disp: 16 g, Rfl: 2   ipratropium (ATROVENT) 0.03 % nasal spray, Place 2 sprays into both nostrils every 12 (twelve) hours. (Patient not taking: Reported on 04/05/2021), Disp: 30 mL, Rfl: 12  Current Facility-Administered Medications:    0.9 %  sodium chloride infusion, 500 mL, Intravenous, Once, Nandigam, Venia Minks, MD

## 2021-04-05 NOTE — Progress Notes (Signed)
Patient seen in the office today and instructed on use of Spiriva 2.4mcg.  Patient expressed understanding and demonstrated technique.  Benetta Spar [facility] 04/05/21

## 2021-04-06 ENCOUNTER — Encounter: Payer: Self-pay | Admitting: Pulmonary Disease

## 2021-05-22 DIAGNOSIS — J0100 Acute maxillary sinusitis, unspecified: Secondary | ICD-10-CM | POA: Diagnosis not present

## 2021-05-22 DIAGNOSIS — R051 Acute cough: Secondary | ICD-10-CM | POA: Diagnosis not present

## 2022-07-18 IMAGING — CT CT CHEST HIGH RESOLUTION W/O CM
2 of 7 series · 14 of 36 positions shown, 17 images · non-contrast
Comparison: High-resolution chest CT 08/07/2015.

CLINICAL DATA: 87-year-old female with history of dry cough for
several years. Recent history of COVID. Evaluate for interstitial
lung disease.

EXAM:
CT CHEST WITHOUT CONTRAST
TECHNIQUE: Multidetector CT imaging of the chest was performed following the
standard protocol without intravenous contrast. High resolution
imaging of the lungs, as well as inspiratory and expiratory imaging,
was performed.

[Series 4: high resolution · axial · 0.63mm/px · z∈[-284,-54]mm · 11 of 278 slices shown, 14 images]
[im 24/278  mediastinal]
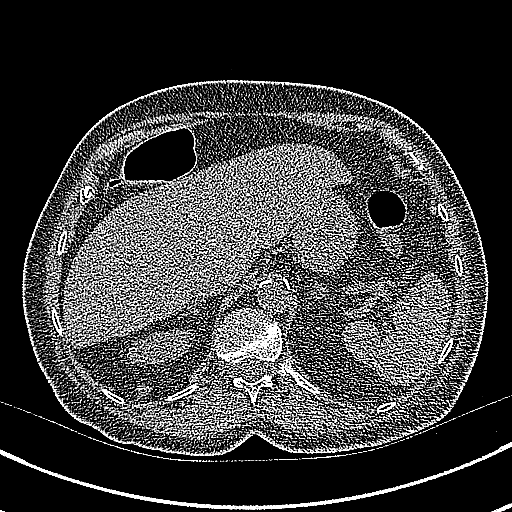
[im 24/278  lung]
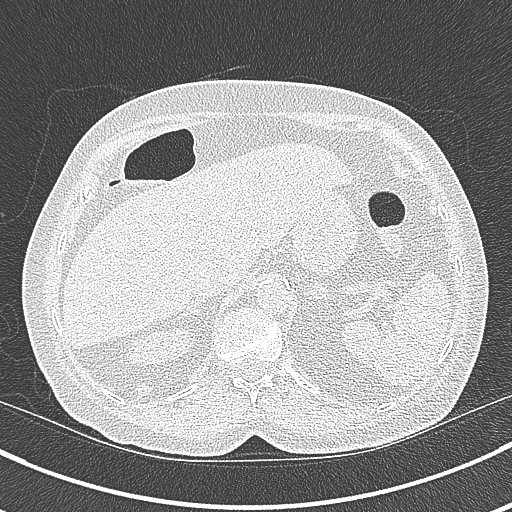
[im 47/278  lung]
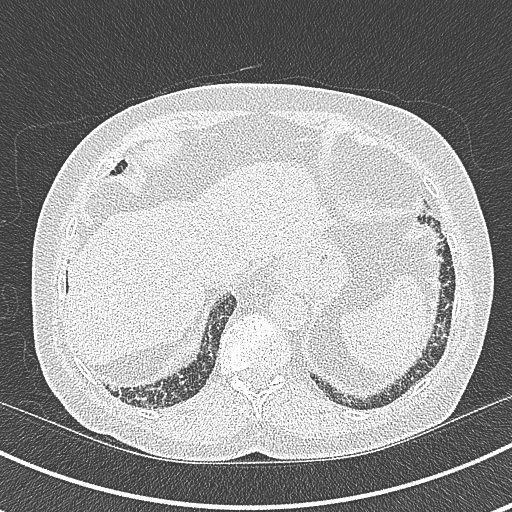
[im 70/278  lung]
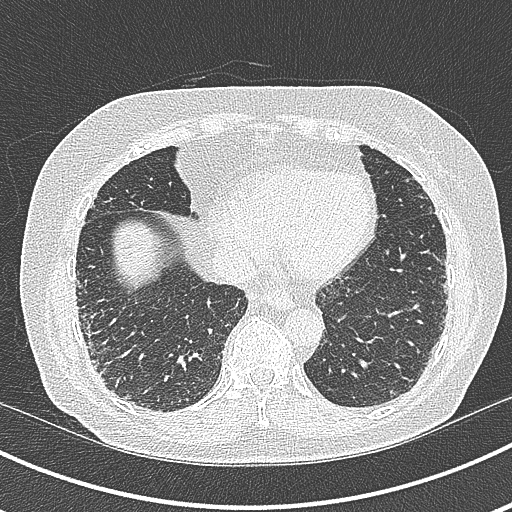
[im 93/278  lung]
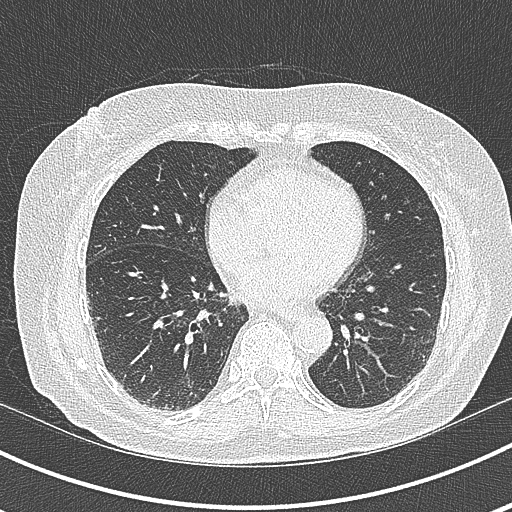
[im 116/278  mediastinal]
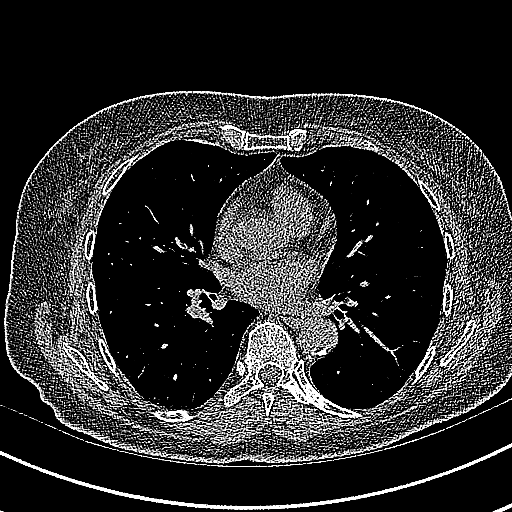
[im 116/278  lung]
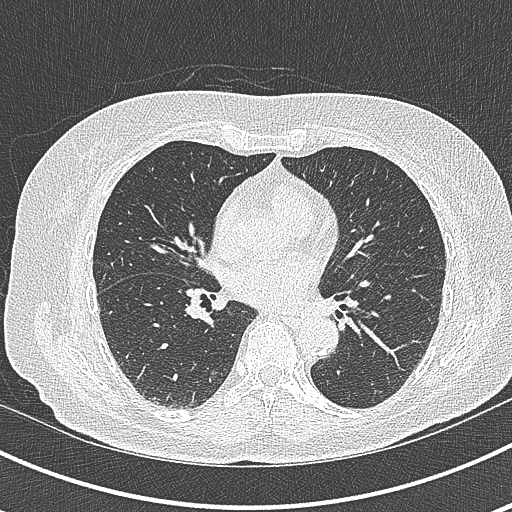
[im 139/278  lung]
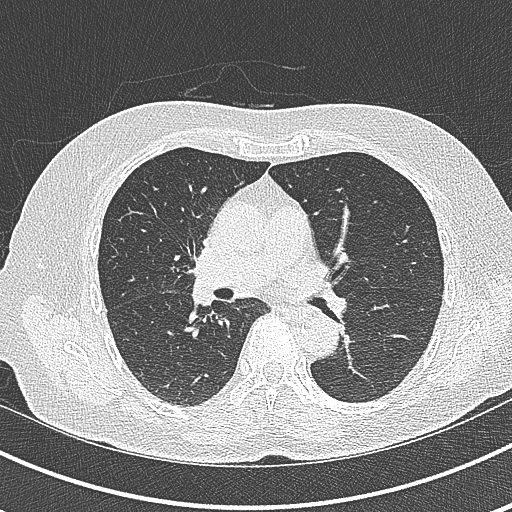
[im 162/278  lung]
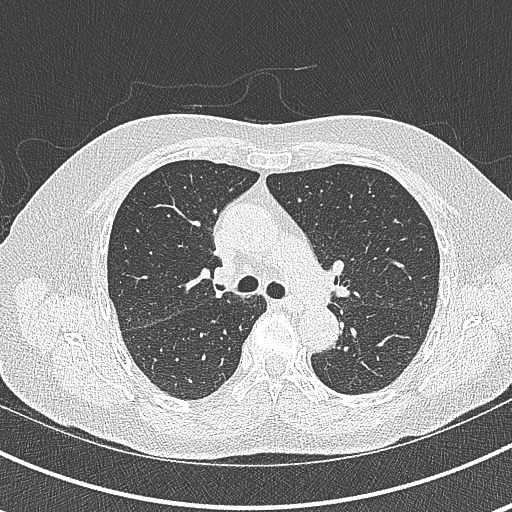
[im 185/278  lung]
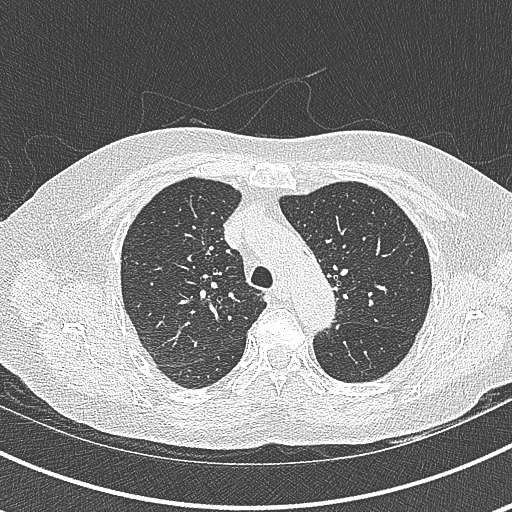
[im 208/278  mediastinal]
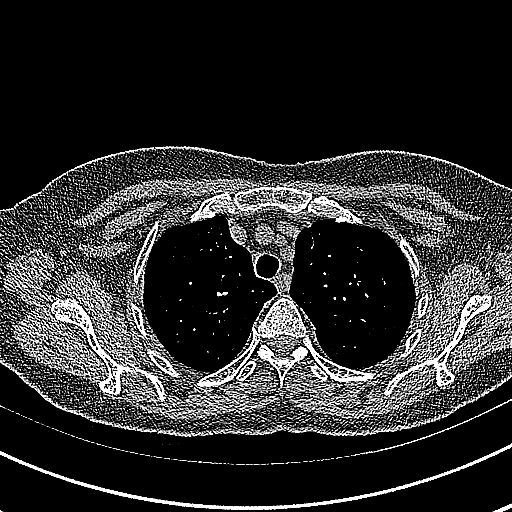
[im 208/278  lung]
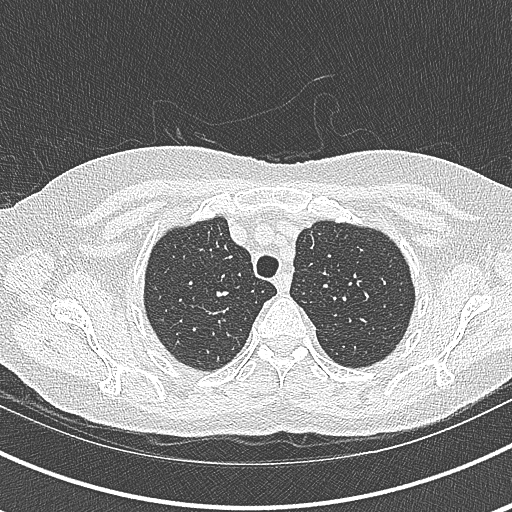
[im 231/278  lung]
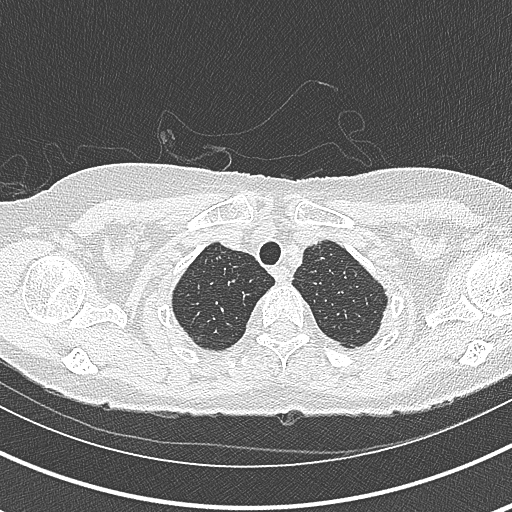
[im 254/278  lung]
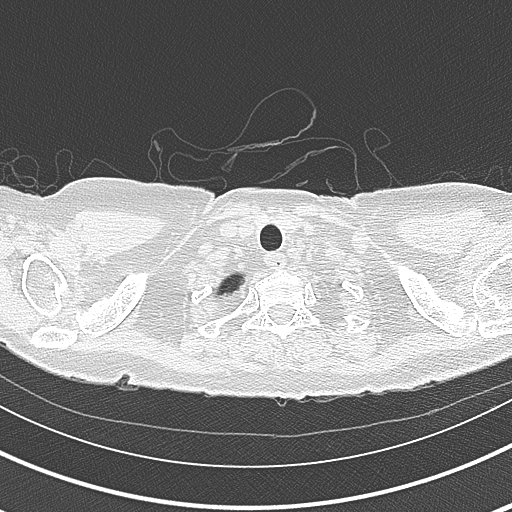

[Series 6: coronal · coronal · 0.55mm/px · 3 of 107 slices shown]
[im 22/107  lung]
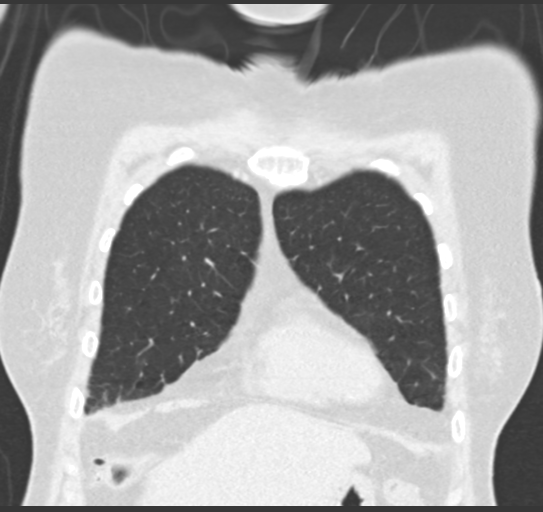
[im 43/107  lung]
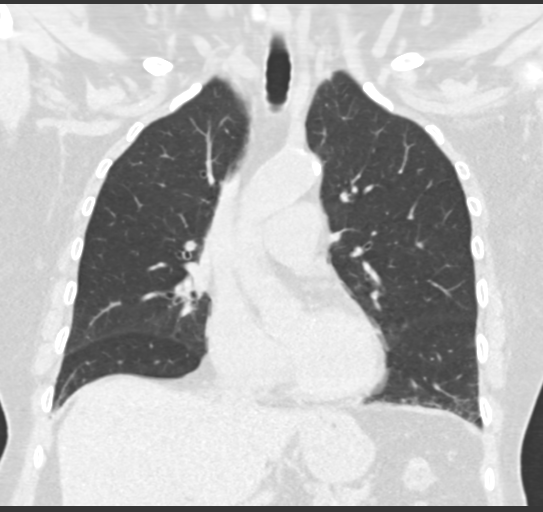
[im 64/107  lung]
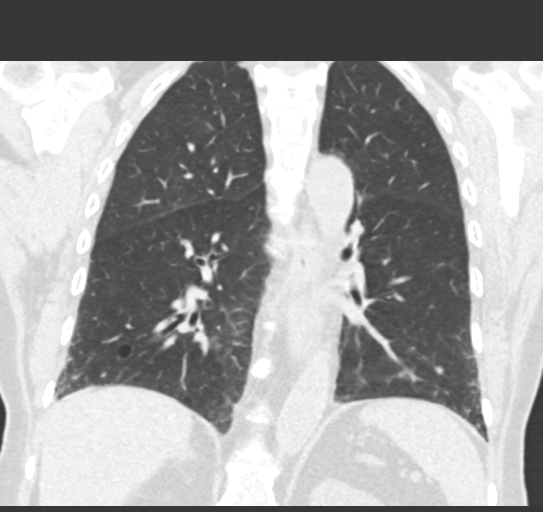

[14 of 36 positions shown; findings below may reference images not displayed]

FINDINGS: Cardiovascular: Heart size is normal. There is no significant
pericardial fluid, thickening or pericardial calcification. There is
aortic atherosclerosis, as well as atherosclerosis of the great
vessels of the mediastinum and the coronary arteries, including
calcified atherosclerotic plaque in the left anterior descending and
right coronary arteries.

Mediastinum/Nodes: No pathologically enlarged mediastinal or hilar
lymph nodes. Please note that accurate exclusion of hilar adenopathy
is limited on noncontrast CT scans. Small hiatal hernia. No axillary
lymphadenopathy.

Lungs/Pleura: High-resolution images demonstrates some areas of mild
peripheral ground-glass attenuation, septal thickening, subpleural
reticulation and peripheral traction bronchiolectasis. No frank
honeycombing. Findings have a definitive craniocaudal gradient and
appear mildly progressive compared to the prior study. Inspiratory
and expiratory imaging is unremarkable. No acute consolidative
airspace disease. No pleural effusions. No definite suspicious
appearing pulmonary nodules or masses are noted.

Upper Abdomen: Aortic atherosclerosis.

Musculoskeletal: There are no aggressive appearing lytic or blastic
lesions noted in the visualized portions of the skeleton.
IMPRESSION: 1. The appearance of the lungs is compatible with interstitial lung
disease, with a spectrum of findings now categorized as probable
usual interstitial pneumonia (UIP) per current ATS guidelines.
Findings appear mildly progressive compared to prior study from
7297.
2. Aortic atherosclerosis, in addition to 2 vessel coronary artery
disease.

Aortic Atherosclerosis ([id number]).

## 2022-07-22 ENCOUNTER — Encounter: Payer: Self-pay | Admitting: Physician Assistant

## 2022-07-22 ENCOUNTER — Ambulatory Visit (INDEPENDENT_AMBULATORY_CARE_PROVIDER_SITE_OTHER): Payer: PPO | Admitting: Physician Assistant

## 2022-07-22 VITALS — BP 122/70 | HR 83 | Ht 62.5 in | Wt 125.0 lb

## 2022-07-22 DIAGNOSIS — R1013 Epigastric pain: Secondary | ICD-10-CM | POA: Diagnosis not present

## 2022-07-22 DIAGNOSIS — Z800 Family history of malignant neoplasm of digestive organs: Secondary | ICD-10-CM | POA: Diagnosis not present

## 2022-07-22 DIAGNOSIS — K219 Gastro-esophageal reflux disease without esophagitis: Secondary | ICD-10-CM | POA: Diagnosis not present

## 2022-07-22 MED ORDER — OMEPRAZOLE 40 MG PO CPDR: 40.0000 mg | DELAYED_RELEASE_CAPSULE | Freq: Every day | ORAL | 5 refills | Status: DC

## 2022-07-22 NOTE — Progress Notes (Signed)
Chief Complaint: Follow-up EGD  HPI:    [name] is a 89 year old female with a past medical history as listed below including hiatal hernia and GERD, known to Dr. Silverio Decamp, who was referred to me by Kathe Becton, DO for a follow-up after EGD.    07/25/2020 EGD with LA grade C esophagitis with no bleeding, a 4 cm hiatal hernia, patchy mild inflammation characterized by congestion, erosions and erythema in the gastric antrum and prepyloric region of the stomach.      10/19/2020 office visit with Dr. Silverio Decamp for follow-up of reflux.  At that time reflux symptoms significantly improved with daily Pantoprazole before breakfast and Pepcid at bedtime.    Today, patient explains that when she saw Dr. Silverio Decamp last she had her on 3 different medicines and they really were not working.  She called back later and was just switched back to Omeprazole 20 mg every morning, she took this for the past year and a half, but over the past 5 months or so she came off of Omeprazole because she is scared due to the fact that she also has osteoporosis and knows this can make it worse.  She started using Apple cider vinegar and honey daily and tells me it works just as well as Omeprazole, also using occasional Pepcid or Tums in the evening for breakthrough symptoms.  Over the past 3 months or so though she has developed an epigastric pain/tenderness right underneath her breasts, this is worse when she is wearing a tight bra, she wonders if it is her hiatal hernia.  Does tell me that she is nervous because her brother passed away from esophageal cancer that "spread everywhere".    Denies fever, chills, weight loss, blood in her stool, nausea or vomiting.  Past Medical History:  Diagnosis Date   Arthritis    GERD (gastroesophageal reflux disease)    History of hiatal hernia    Osteoporosis    PONV (postoperative nausea and vomiting)     Past Surgical History:  Procedure Laterality Date   APPENDECTOMY      BUNIONECTOMY     left    2005   CATARACT EXTRACTION W/ INTRAOCULAR LENS  IMPLANT, BILATERAL     2009   CERVICAL CONE BIOPSY     cervical cryosurgery     90-95   CHOLECYSTECTOMY     2001   ERCP     2001  internal bleed   FOOT SURGERY     rt  1981+82   HEMORRHOID SURGERY     63+66   HERNIA REPAIR     partial hip repair2017     right   TOTAL KNEE ARTHROPLASTY Right 08/18/2017   Procedure: RIGHT TOTAL KNEE ARTHROPLASTY;  Surgeon: Vickey Huger, MD;  Location: [facility];  Service: Orthopedics;  Laterality: Right;   TUBAL LIGATION     73    Current Outpatient Medications  Medication Sig Dispense Refill   albuterol (VENTOLIN HFA) 108 (90 Base) MCG/ACT inhaler Inhale 1-2 puffs into the lungs every 6 (six) hours as needed. 8 g 6   Apple Cider Vinegar 188 MG CAPS Take by mouth.     Calcium Carb-Cholecalciferol (CALCIUM + D3 PO) Take 2 tablets by mouth daily.     Cholecalciferol (VITAMIN D3) 125 MCG (5000 UT) CAPS Take 1 capsule by mouth 2 (two) times a week.     denosumab (PROLIA) 60 MG/ML SOSY injection Inject 60 mg into the skin every 6 (six) months.  Docusate Calcium (STOOL SOFTENER PO) Take 1 tablet by mouth daily.     famotidine (PEPCID) 20 MG tablet Take 20 mg by mouth daily as needed for heartburn or indigestion.     Krill Oil 350 MG CAPS Take 1 capsule by mouth daily.     Multiple Vitamins-Minerals (EMERGEN-C IMMUNE) PACK Take by mouth.     Multiple Vitamins-Minerals (MULTIVITAMIN WITH MINERALS) tablet Take 1 tablet by mouth daily.     polyethylene glycol (MIRALAX / GLYCOLAX) 17 g packet Take 17 g by mouth daily.     Tiotropium Bromide Monohydrate (SPIRIVA RESPIMAT) 2.5 MCG/ACT AERS Inhale 2 puffs into the lungs daily. 8 g 0   Current Facility-Administered Medications  Medication Dose Route Frequency Provider Last Rate Last Admin   0.9 %  sodium chloride infusion  500 mL Intravenous Once Mauri Pole, MD        Allergies as of 07/22/2022 - Review Complete 07/22/2022   Allergen Reaction Noted   Codeine Nausea And Vomiting 07/31/2017   Penicillins Rash 07/31/2017    Family History  Problem Relation Age of Onset   Emphysema Mother    Stomach cancer Father    Prostate cancer Sister    Colon cancer Neg Hx    Esophageal cancer Neg Hx    Pancreatic cancer Neg Hx     Social History   Socioeconomic History   Marital status: Single    Spouse name: Not on file   Number of children: Not on file   Years of education: Not on file   Highest education level: Not on file  Occupational History   Not on file  Tobacco Use   Smoking status: Former    Types: Cigarettes   Smokeless tobacco: Never  Vaping Use   Vaping Use: Never used  Substance and Sexual Activity   Alcohol use: No   Drug use: No   Sexual activity: Not on file  Other Topics Concern   Not on file  Social History Narrative   Not on file   Social Determinants of Health   Financial Resource Strain: Not on file  Food Insecurity: Not on file  Transportation Needs: Not on file  Physical Activity: Not on file  Stress: Not on file  Social Connections: Not on file  Intimate Partner Violence: Not on file    Review of Systems:    Constitutional: No weight loss, fever or chills Cardiovascular: No chest pain Respiratory: No SOB  Gastrointestinal: See HPI and otherwise negative   Physical Exam:  Vital signs: BP 122/70   Pulse 83   Ht 5' 2.5" (1.588 m)   Wt 125 lb (56.7 kg)   BMI 22.50 kg/m    Constitutional:   Pleasant Elderly Caucasian female appears to be in NAD, Well developed, Well nourished, alert and cooperative Respiratory: Respirations even and unlabored. Lungs clear to auscultation bilaterally.   No wheezes, crackles, or rhonchi.  Cardiovascular: Normal S1, S2. No MRG. Regular rate and rhythm. No peripheral edema, cyanosis or pallor.  Gastrointestinal:  Soft, nondistended, mild-mod epigastric ttp. No rebound or guarding. Normal bowel sounds. No appreciable masses or  hepatomegaly. Rectal:  Not performed.  Psychiatric: Oriented to person, place and time. Demonstrates good judgement and reason without abnormal affect or behaviors.  No recent labs.  Assessment: 1.  GERD: Breakthrough symptoms regardless of apple cider vinegar and as needed Pepcid, last EGD in 2022 with LA grade C esophagitis and erythema and congestion in the antrum; likely gastritis +/- known  hiatal hernia 2.  Epigastric pain: See above 3.  Family history of esophageal cancer in her brother  Plan: 1.  Discussed with patient that the benefit outweighs the risk for her given her family history of esophageal cancer and her last EGD results.  I would like her to restart Omeprazole at 40 mg every morning, 30-60 minutes before breakfast, prescribed #30 with 5 refills. 2.  Also continue Pepcid 20 mg nightly for the next month and then use as needed for breakthrough symptoms 3.  We had a long discussion about the risks and benefits of PPIs.  Spent a lot of time explaining why these medications are used and answered all of her questions. 4.  Patient to follow in clinic in 2 months.  If she is still having epigastric pain at that time could consider repeat EGD to consider twice daily dosing of Omeprazole.  Ellouise Newer, PA-C [facility] 07/22/2022, 9:57 AM  Cc: Kathe Becton, DO

## 2022-07-22 NOTE — Patient Instructions (Addendum)
We have sent the following medications to your pharmacy for you to pick up at your convenience:   Start Omeprazole 40 mg take 1 capsule by mouth daily 30-60 minutes before breakfast.  Please purchase the following medications over the counter and take as directed:  Start Pepcid daily for 1 month  then take it as needed   Follow up in 2 months  If your blood pressure at your visit was 140/90 or greater, please contact your primary care physician to follow up on this.  _______________________________________________________  If you are age 53 or older, your body mass index should be between 23-30. Your Body mass index is 22.5 kg/m. If this is out of the aforementioned range listed, please consider follow up with your Primary Care Provider.  If you are age 54 or younger, your body mass index should be between 19-25. Your Body mass index is 22.5 kg/m. If this is out of the aformentioned range listed, please consider follow up with your Primary Care Provider.   ________________________________________________________  The [facility] providers would like to encourage you to use [facility] to communicate with providers for non-urgent requests or questions.  Due to long hold times on the telephone, sending your provider a message by [facility] may be a faster and more efficient way to get a response.  Please allow 48 business hours for a response.  Please remember that this is for non-urgent requests.   Thank you for entrusting me with your care and choosing [facility].  Ellouise Newer PA-C

## 2022-10-22 ENCOUNTER — Encounter: Payer: Self-pay | Admitting: Physician Assistant

## 2022-10-22 ENCOUNTER — Ambulatory Visit (INDEPENDENT_AMBULATORY_CARE_PROVIDER_SITE_OTHER): Payer: PPO | Admitting: Physician Assistant

## 2022-10-22 VITALS — BP 134/80 | HR 80 | Ht 62.0 in | Wt 128.5 lb

## 2022-10-22 DIAGNOSIS — K219 Gastro-esophageal reflux disease without esophagitis: Secondary | ICD-10-CM | POA: Diagnosis not present

## 2022-10-22 DIAGNOSIS — K5900 Constipation, unspecified: Secondary | ICD-10-CM

## 2022-10-22 DIAGNOSIS — Z800 Family history of malignant neoplasm of digestive organs: Secondary | ICD-10-CM | POA: Diagnosis not present

## 2022-10-22 DIAGNOSIS — R0989 Other specified symptoms and signs involving the circulatory and respiratory systems: Secondary | ICD-10-CM

## 2022-10-22 DIAGNOSIS — R1013 Epigastric pain: Secondary | ICD-10-CM | POA: Diagnosis not present

## 2022-10-22 NOTE — Patient Instructions (Signed)
Continue Omeprazole.   Restart Famotidine.   Increase your Miralax to twice daily. If diarrhea recurs then decrease to once daily or every other day.   You have been scheduled for an endoscopy. Please follow written instructions given to you at your visit today. If you use inhalers (even only as needed), please bring them with you on the day of your procedure.  _______________________________________________________  If your blood pressure at your visit was 140/90 or greater, please contact your primary care physician to follow up on this.  _______________________________________________________  If you are age 87 or older, your body mass index should be between 23-30. Your Body mass index is 23.5 kg/m. If this is out of the aforementioned range listed, please consider follow up with your Primary Care Provider.  If you are age 19 or younger, your body mass index should be between 19-25. Your Body mass index is 23.5 kg/m. If this is out of the aformentioned range listed, please consider follow up with your Primary Care Provider.   ________________________________________________________  The [facility] providers would like to encourage you to use [facility] to communicate with providers for non-urgent requests or questions.  Due to long hold times on the telephone, sending your provider a message by [facility] may be a faster and more efficient way to get a response.  Please allow 48 business hours for a response.  Please remember that this is for non-urgent requests.  _______________________________________________________  Thank you for choosing me and [facility].  Hyacinth Meeker PA-C

## 2022-10-22 NOTE — Progress Notes (Signed)
Chief Complaint: Reflux  HPI:    [name] is an 89 year old female with a past medical history as listed below including reflux, known to Dr. Lavon Paganini, who returns to clinic today for follow-up of her reflux.      07/25/2020 EGD with LA grade C esophagitis with no bleeding, a 4 cm hiatal hernia, patchy mild inflammation characterized by congestion, erosions and erythema in the gastric antrum and prepyloric region of the stomach.      10/19/2020 office visit with Dr. Lavon Paganini for follow-up of reflux.  At that time reflux symptoms significantly improved with daily Pantoprazole before breakfast and Pepcid at bedtime.    07/22/2022 patient seen in clinic and at that time is stopped Omeprazole because she has osteoporosis and was worried about side effects.  She has started Apple cider vinegar and honey and it worked just as well as Omeprazole with the occasional Pepcid or Tums.  Did describe some epigastric pain/tenderness which developed under her breasts over the past 3 months.  At that time she was asked to restart Omeprazole as the benefits outweigh the risk given her family history of esophageal cancer and her last EGD results.  She is starting 40 mg every morning.  Also continued on Pepcid 20 mg nightly for the next month.    Today, patient presents to clinic and tells me that regardless of the Omeprazole 40 mg every morning and Pepcid 20 mg at night she continues with symptoms of some epigastric pain though this has gotten slightly better after being back on this medicine.  Also continues with hoarseness and throat clearing and feels like there is always "something in my throat".  She is worried that something else is going on and wants to make sure that everything is okay and there.    Also discusses chronic constipation.  Typically managed with Senokot x 2 and MiraLAX in the evenings but has noticed that recently sometimes this does not work.  She does feel full and bloated if she does not have a  bowel movement daily in the morning.    Denies fever, chills, weight loss, blood in her stool, nausea or vomiting.  Past Surgical History:  Procedure Laterality Date   APPENDECTOMY     BUNIONECTOMY     left    2005   CATARACT EXTRACTION W/ INTRAOCULAR LENS  IMPLANT, BILATERAL     2009   CERVICAL CONE BIOPSY     cervical cryosurgery     90-95   CHOLECYSTECTOMY     2001   ERCP     2001  internal bleed   FOOT SURGERY     rt  1981+82   HEMORRHOID SURGERY     63+66   HERNIA REPAIR     partial hip repair2017     right   TOTAL KNEE ARTHROPLASTY Right 08/18/2017   Procedure: RIGHT TOTAL KNEE ARTHROPLASTY;  Surgeon: Dannielle Huh, MD;  Location: [facility];  Service: Orthopedics;  Laterality: Right;   TUBAL LIGATION     73    Current Outpatient Medications  Medication Sig Dispense Refill   albuterol (VENTOLIN HFA) 108 (90 Base) MCG/ACT inhaler Inhale 1-2 puffs into the lungs every 6 (six) hours as needed. 8 g 6   Apple Cider Vinegar 188 MG CAPS Take by mouth.     Calcium Carb-Cholecalciferol (CALCIUM + D3 PO) Take 2 tablets by mouth daily.     Cholecalciferol (VITAMIN D3) 125 MCG (5000 UT) CAPS Take 1 capsule by mouth  2 (two) times a week.     denosumab (PROLIA) 60 MG/ML SOSY injection Inject 60 mg into the skin every 6 (six) months.     Docusate Calcium (STOOL SOFTENER PO) Take 1 tablet by mouth daily.     famotidine (PEPCID) 20 MG tablet Take 20 mg by mouth daily as needed for heartburn or indigestion.     Krill Oil 350 MG CAPS Take 1 capsule by mouth daily.     Multiple Vitamins-Minerals (EMERGEN-C IMMUNE) PACK Take by mouth.     Multiple Vitamins-Minerals (MULTIVITAMIN WITH MINERALS) tablet Take 1 tablet by mouth daily.     omeprazole (PRILOSEC) 40 MG capsule Take 1 capsule (40 mg total) by mouth daily. 30 capsule 5   polyethylene glycol (MIRALAX / GLYCOLAX) 17 g packet Take 17 g by mouth daily.     Tiotropium Bromide Monohydrate (SPIRIVA RESPIMAT) 2.5 MCG/ACT AERS Inhale 2 puffs into  the lungs daily. 8 g 0   Current Facility-Administered Medications  Medication Dose Route Frequency Provider Last Rate Last Admin   0.9 %  sodium chloride infusion  500 mL Intravenous Once Napoleon Form, MD        Allergies as of 10/22/2022 - Review Complete 07/22/2022  Allergen Reaction Noted   Codeine Nausea And Vomiting 07/31/2017   Penicillins Rash 07/31/2017    Family History  Problem Relation Age of Onset   Emphysema Mother    Stomach cancer Father    Prostate cancer Sister    Colon cancer Neg Hx    Esophageal cancer Neg Hx    Pancreatic cancer Neg Hx     Social History   Socioeconomic History   Marital status: Single    Spouse name: Not on file   Number of children: Not on file   Years of education: Not on file   Highest education level: Not on file  Occupational History   Not on file  Tobacco Use   Smoking status: Former    Types: Cigarettes   Smokeless tobacco: Never  Vaping Use   Vaping Use: Never used  Substance and Sexual Activity   Alcohol use: No   Drug use: No   Sexual activity: Not on file  Other Topics Concern   Not on file  Social History Narrative   Not on file   Social Determinants of Health   Financial Resource Strain: Not on file  Food Insecurity: Not on file  Transportation Needs: Not on file  Physical Activity: Not on file  Stress: Not on file  Social Connections: Not on file  Intimate Partner Violence: Not on file    Review of Systems:    Constitutional: No weight loss, fever or chills Cardiovascular: No chest pain  Respiratory: No SOB Gastrointestinal: See HPI and otherwise negative   Physical Exam:  Vital signs: BP 134/80 (BP Location: Left Arm, Patient Position: Sitting, Cuff Size: Normal)   Pulse 80   Ht 5\' 2"  (1.575 m)   Wt 128 lb 8 oz (58.3 kg)   BMI 23.50 kg/m    Constitutional:   Pleasant Elderly Caucasian female appears to be in NAD, Well developed, Well nourished, alert and cooperative Respiratory:  Respirations even and unlabored. Lungs clear to auscultation bilaterally.   No wheezes, crackles, or rhonchi.  Cardiovascular: Normal S1, S2. No MRG. Regular rate and rhythm. No peripheral edema, cyanosis or pallor.  Gastrointestinal:  Soft, nondistended, nontender. No rebound or guarding. Normal bowel sounds. No appreciable masses or hepatomegaly. Rectal:  Not performed.  Psychiatric: Oriented to person, place and time. Demonstrates good judgement and reason without abnormal affect or behaviors.  RELEVANT LABS AND IMAGING: CBC    Component Value Date/Time   WBC 12.2 (H) 08/20/2017 0847   RBC 3.93 08/20/2017 0847   HGB 12.2 08/20/2017 0847   HCT 36.2 08/20/2017 0847   PLT 210 08/20/2017 0847   MCV 92.1 08/20/2017 0847   MCH 31.0 08/20/2017 0847   MCHC 33.7 08/20/2017 0847   RDW 12.5 08/20/2017 0847   LYMPHSABS 2.0 08/08/2017 1414   MONOABS 0.5 08/08/2017 1414   EOSABS 0.2 08/08/2017 1414   BASOSABS 0.0 08/08/2017 1414    CMP     Component Value Date/Time   NA 137 08/19/2017 0546   K 4.3 08/19/2017 0546   CL 103 08/19/2017 0546   CO2 25 08/19/2017 0546   GLUCOSE 114 (H) 08/19/2017 0546   BUN 15 08/19/2017 0546   CREATININE 0.76 08/19/2017 0546   CALCIUM 8.8 (L) 08/19/2017 0546   PROT 6.7 08/08/2017 1414   ALBUMIN 4.2 08/08/2017 1414   AST 24 08/08/2017 1414   ALT 13 (L) 08/08/2017 1414   ALKPHOS 86 08/08/2017 1414   BILITOT 0.4 08/08/2017 1414   GFRNONAA >60 08/19/2017 0546   GFRAA >60 08/19/2017 0546    Assessment: 1.  GERD: Continues with symptoms of epigastric pain and heartburn hoarseness regardless of restarting Omeprazole 40 daily and Pepcid 20 mg at night, does have a family history of esophageal cancer in her brother and a personal history of esophagitis; consider ongoing reflux/gastritis most likely 2.  Epigastric pain: With above 3.  Hoarseness 4.  Family history of esophageal cancer in her brother 5.  Constipation: Patient has noticed slowing of her bowels  over the past few months with only 1 bowel movement maybe once every other day regardless of meds  Plan: 1.  Scheduled patient for diagnostic EGD in the [facility] with Dr. Lavon Paganini.  Did provide the patient a detailed list of risks for procedure and she agrees to proceed. Patient is appropriate for endoscopic procedure(s) in the ambulatory ([facility]) setting.  2.  Patient will continue her current bowel regimen and add another dose of MiraLAX daily. 3.  Continue Omeprazole 40 mg every morning and restart Pepcid 20 mg nightly until time of EGD. 4.  Patient to follow up based on recommendations from Dr. Lavon Paganini after time of procedure.  Breanna Meeker, PA-C [facility] 10/22/2022, 9:53 AM  Cc: Laurena Bering, DO

## 2022-10-30 NOTE — Progress Notes (Signed)
Reviewed and agree with documentation and assessment and plan. K. Veena Giavanni Odonovan , MD   

## 2022-12-22 ENCOUNTER — Encounter: Payer: Self-pay | Admitting: Certified Registered Nurse Anesthetist

## 2022-12-25 ENCOUNTER — Encounter: Payer: Self-pay | Admitting: Gastroenterology

## 2022-12-25 ENCOUNTER — Ambulatory Visit: Payer: PPO | Admitting: Gastroenterology

## 2022-12-25 VITALS — BP 130/71 | HR 80 | Temp 98.2°F | Resp 12 | Ht 62.0 in | Wt 128.0 lb

## 2022-12-25 DIAGNOSIS — K219 Gastro-esophageal reflux disease without esophagitis: Secondary | ICD-10-CM

## 2022-12-25 MED ORDER — SODIUM CHLORIDE 0.9 % IV SOLN
500.0000 mL | Freq: Once | INTRAVENOUS | Status: AC
Start: 2022-12-25 — End: ?

## 2022-12-25 NOTE — Progress Notes (Signed)
1001 Robinul 0.1 mg IV given due large amount of secretions upon assessment.  MD made aware, vss  

## 2022-12-25 NOTE — Op Note (Signed)
[facility] Patient Name: Breanna Montes Procedure Date: 12/25/2022 9:53 AM MRN: [id number] Endoscopist: Napoleon Form , MD, [id number] Age: 89 Referring MD:  Date of Birth: [birth year] Gender: Female Account #: [id number] Procedure:                Upper GI endoscopy Indications:              Epigastric abdominal pain, Esophageal reflux                            symptoms that recur despite appropriate therapy Medicines:                Monitored Anesthesia Care Procedure:                Pre-Anesthesia Assessment:                           - Prior to the procedure, a History and Physical                            was performed, and patient medications and                            allergies were reviewed. The patient's tolerance of                            previous anesthesia was also reviewed. The risks                            and benefits of the procedure and the sedation                            options and risks were discussed with the patient.                            All questions were answered, and informed consent                            was obtained. Prior Anticoagulants: The patient has                            taken no anticoagulant or antiplatelet agents. ASA                            Grade Assessment: II - A patient with mild systemic                            disease. After reviewing the risks and benefits,                            the patient was deemed in satisfactory condition to                            undergo the procedure.  After obtaining informed consent, the endoscope was                            passed under direct vision. Throughout the                            procedure, the patient's blood pressure, pulse, and                            oxygen saturations were monitored continuously. The                            GIF [id number] #[id number] was introduced through the                            mouth, and  advanced to the second part of duodenum.                            The upper GI endoscopy was accomplished without                            difficulty. The patient tolerated the procedure                            well. Scope In: Scope Out: Findings:                 The Z-line was regular and was found 35 cm from the                            incisors.                           A 5 cm hiatal hernia was present.                           The exam of the esophagus was otherwise normal.                           The stomach was normal.                           The cardia and gastric fundus were normal on                            retroflexion.                           The examined duodenum was normal. Complications:            No immediate complications. Estimated Blood Loss:     Estimated blood loss was minimal. Impression:               - Z-line regular, 35 cm from the incisors.                           -  5 cm hiatal hernia.                           - Normal stomach.                           - Normal examined duodenum.                           - No specimens collected. Recommendation:           - Patient has a contact number available for                            emergencies. The signs and symptoms of potential                            delayed complications were discussed with the                            patient. Return to normal activities tomorrow.                            Written discharge instructions were provided to the                            patient.                           - Resume previous diet.                           - Continue present medications.                           - Follow an antireflux regimen.                           - Continue daily PPI and pepcid at bedtime as needed                           - Return to GI office in 6 months. Napoleon Form, MD 12/25/2022 10:12:52 AM This report has been signed electronically.

## 2022-12-25 NOTE — Progress Notes (Signed)
[facility] History and Physical   Primary Care Physician:  Breanna Bering, DO   Reason for Procedure:  Persistent/recurrent GERD symptoms despite PPI and epigastric pain  Plan:    EGD  with possible interventions as needed     HPI: Breanna Montes is a very pleasant 89 y.o. female here for EGD for evaluation of persistent/recurrent GERD symptoms despite PPI and epigastric pain  The risks and benefits as well as alternatives of endoscopic procedure(s) have been discussed and reviewed. All questions answered. The patient agrees to proceed.    Past Medical History:  Diagnosis Date   Arthritis    GERD (gastroesophageal reflux disease)    History of hiatal hernia    Osteoporosis    PONV (postoperative nausea and vomiting)     Past Surgical History:  Procedure Laterality Date   APPENDECTOMY     BUNIONECTOMY     left    2005   CATARACT EXTRACTION W/ INTRAOCULAR LENS  IMPLANT, BILATERAL     2009   CERVICAL CONE BIOPSY     cervical cryosurgery     90-95   CHOLECYSTECTOMY     2001   ERCP     2001  internal bleed   FOOT SURGERY     rt  1981+82   HEMORRHOID SURGERY     63+66   HERNIA REPAIR     partial hip repair2017     right   TOTAL KNEE ARTHROPLASTY Right 08/18/2017   Procedure: RIGHT TOTAL KNEE ARTHROPLASTY;  Surgeon: Breanna Huh, MD;  Location: [facility] OR;  Service: Orthopedics;  Laterality: Right;   TUBAL LIGATION     73    Prior to Admission medications   Medication Sig Start Date End Date Taking? Authorizing Provider  Cholecalciferol (VITAMIN D3) 125 MCG (5000 UT) CAPS Take 1 capsule by mouth 2 (two) times a week.   Yes [provider]  Docusate Calcium (STOOL SOFTENER PO) Take 1 tablet by mouth daily.   Yes [provider]  [name] 350 MG CAPS Take 1 capsule by mouth daily.   Yes [provider]  Multiple Vitamins-Minerals (MULTIVITAMIN WITH MINERALS) tablet Take 1 tablet by mouth daily.   Yes [provider]   omeprazole (PRILOSEC) 40 MG capsule Take 1 capsule (40 mg total) by mouth daily. 07/22/22  Yes Lemmon, Breanna Baldy, PA  polyethylene glycol (MIRALAX / GLYCOLAX) 17 g packet Take 17 g by mouth daily.   Yes [provider]  Vitamin D, Ergocalciferol, (DRISDOL) 1.25 MG (50000 UNIT) CAPS capsule Take 1 capsule by mouth once a week. 11/20/22 11/20/23 Yes [provider]  albuterol (VENTOLIN HFA) 108 (90 Base) MCG/ACT inhaler Inhale 1-2 puffs into the lungs every 6 (six) hours as needed. 04/05/21   Breanna Sinner, MD  cycloSPORINE (RESTASIS) 0.05 % ophthalmic emulsion Place 1 drop into both eyes as needed. Patient not taking: Reported on 12/25/2022    [provider]  denosumab (PROLIA) 60 MG/ML SOSY injection Inject 60 mg into the skin every 6 (six) months. Patient not taking: Reported on 10/22/2022    [provider]  famotidine (PEPCID) 20 MG tablet Take 20 mg by mouth daily as needed for heartburn or indigestion.    [provider]    Current Outpatient Medications  Medication Sig Dispense Refill   Cholecalciferol (VITAMIN D3) 125 MCG (5000 UT) CAPS Take 1 capsule by mouth 2 (two) times a week.     Docusate Calcium (STOOL SOFTENER PO)  Take 1 tablet by mouth daily.     Krill Oil 350 MG CAPS Take 1 capsule by mouth daily.     Multiple Vitamins-Minerals (MULTIVITAMIN WITH MINERALS) tablet Take 1 tablet by mouth daily.     omeprazole (PRILOSEC) 40 MG capsule Take 1 capsule (40 mg total) by mouth daily. 30 capsule 5   polyethylene glycol (MIRALAX / GLYCOLAX) 17 g packet Take 17 g by mouth daily.     Vitamin D, Ergocalciferol, (DRISDOL) 1.25 MG (50000 UNIT) CAPS capsule Take 1 capsule by mouth once a week.     albuterol (VENTOLIN HFA) 108 (90 Base) MCG/ACT inhaler Inhale 1-2 puffs into the lungs every 6 (six) hours as needed. 8 g 6   cycloSPORINE (RESTASIS) 0.05 % ophthalmic emulsion Place 1 drop into both eyes as needed. (Patient not taking: Reported on  12/25/2022)     denosumab (PROLIA) 60 MG/ML SOSY injection Inject 60 mg into the skin every 6 (six) months. (Patient not taking: Reported on 10/22/2022)     famotidine (PEPCID) 20 MG tablet Take 20 mg by mouth daily as needed for heartburn or indigestion.     Current Facility-Administered Medications  Medication Dose Route Frequency Provider Last Rate Last Admin   0.9 %  sodium chloride infusion  500 mL Intravenous Once Breanna Branscum V, MD       0.9 %  sodium chloride infusion  500 mL Intravenous Once Breanna Form, MD        Allergies as of 12/25/2022 - Review Complete 12/25/2022  Allergen Reaction Noted   Codeine Nausea And Vomiting 07/31/2017   Penicillins Rash 07/31/2017    Family History  Problem Relation Age of Onset   Emphysema Mother    Stomach cancer Father    Prostate cancer Sister    Colon cancer Neg Hx    Esophageal cancer Neg Hx    Pancreatic cancer Neg Hx     Social History   Socioeconomic History   Marital status: Single    Spouse name: Not on file   Number of children: 3   Years of education: Not on file   Highest education level: Not on file  Occupational History   Not on file  Tobacco Use   Smoking status: Former    Types: Cigarettes   Smokeless tobacco: Never  Vaping Use   Vaping Use: Never used  Substance and Sexual Activity   Alcohol use: No   Drug use: No   Sexual activity: Not on file  Other Topics Concern   Not on file  Social History Narrative   Not on file   Social Determinants of Health   Financial Resource Strain: Not on file  Food Insecurity: Not on file  Transportation Needs: Not on file  Physical Activity: Not on file  Stress: Not on file  Social Connections: Not on file  Intimate Partner Violence: Not on file    Review of Systems:  All other review of systems negative except as mentioned in the HPI.  Physical Exam: Vital signs in last 24 hours: Blood Pressure (Abnormal) 155/77   Pulse 83   Temperature 98.2 F  (36.8 C)   Height 5\' 2"  (1.575 m)   Weight 128 lb (58.1 kg)   Oxygen Saturation 98%   Body Mass Index 23.41 kg/m  General:   Alert, NAD Lungs:  Clear .   Heart:  Regular rate and rhythm Abdomen:  Soft, nontender and nondistended. Neuro/Psych:  Alert and cooperative. Normal mood and affect.  A and O x 3  Reviewed labs, radiology imaging, old records and pertinent past GI work up  Patient is appropriate for planned procedure(s) and anesthesia in an ambulatory setting   K. Scherry Ran , MD [phone]

## 2022-12-25 NOTE — Patient Instructions (Addendum)
Return to office for follow up in 6 months   YOU HAD AN ENDOSCOPIC PROCEDURE TODAY: Refer to the procedure report and other information in the discharge instructions given to you for any specific questions about what was found during the examination. If this information does not answer your questions, please call  office at [phone] to clarify.   YOU SHOULD EXPECT: Some feelings of bloating in the abdomen. Passage of more gas than usual. Walking can help get rid of the air that was put into your GI tract during the procedure and reduce the bloating. If you had a lower endoscopy (such as a colonoscopy or flexible sigmoidoscopy) you may notice spotting of blood in your stool or on the toilet paper. Some abdominal soreness may be present for a day or two, also.  DIET: Your first meal following the procedure should be a light meal and then it is ok to progress to your normal diet. A half-sandwich or bowl of soup is an example of a good first meal. Heavy or fried foods are harder to digest and may make you feel nauseous or bloated. Drink plenty of fluids but you should avoid alcoholic beverages for 24 hours. If you had a esophageal dilation, please see attached instructions for diet.    ACTIVITY: Your care partner should take you home directly after the procedure. You should plan to take it easy, moving slowly for the rest of the day. You can resume normal activity the day after the procedure however YOU SHOULD NOT DRIVE, use power tools, machinery or perform tasks that involve climbing or major physical exertion for 24 hours (because of the sedation medicines used during the test).   SYMPTOMS TO REPORT IMMEDIATELY: A gastroenterologist can be reached at any hour. Please call [phone]  for any of the following symptoms:  Following upper endoscopy (EGD, EUS, ERCP, esophageal dilation) Vomiting of blood or coffee ground material  New, significant abdominal pain  New, significant chest pain or  pain under the shoulder blades  Painful or persistently difficult swallowing  New shortness of breath  Black, tarry-looking or red, bloody stools  FOLLOW UP:  If any biopsies were taken you will be contacted by phone or by letter within the next 1-3 weeks. Call [phone]  if you have not heard about the biopsies in 3 weeks.  Please also call with any specific questions about appointments or follow up tests.

## 2022-12-25 NOTE — Progress Notes (Signed)
Report given to PACU, vss 

## 2022-12-26 ENCOUNTER — Telehealth: Payer: Self-pay | Admitting: *Deleted

## 2022-12-26 NOTE — Telephone Encounter (Signed)
No answer for post procedure followup call. Left VM. ?

## 2023-01-23 ENCOUNTER — Other Ambulatory Visit: Payer: Self-pay | Admitting: Physician Assistant

## 2023-04-17 ENCOUNTER — Other Ambulatory Visit: Payer: Self-pay | Admitting: Pulmonary Disease

## 2023-04-17 DIAGNOSIS — R053 Chronic cough: Secondary | ICD-10-CM

## 2023-08-15 ENCOUNTER — Ambulatory Visit: Payer: PPO | Admitting: Physician Assistant

## 2023-08-15 ENCOUNTER — Encounter: Payer: Self-pay | Admitting: Physician Assistant

## 2023-08-15 VITALS — BP 118/62 | HR 82 | Ht 62.0 in | Wt 125.0 lb

## 2023-08-15 DIAGNOSIS — K5909 Other constipation: Secondary | ICD-10-CM | POA: Diagnosis not present

## 2023-08-15 DIAGNOSIS — K644 Residual hemorrhoidal skin tags: Secondary | ICD-10-CM | POA: Diagnosis not present

## 2023-08-15 DIAGNOSIS — K219 Gastro-esophageal reflux disease without esophagitis: Secondary | ICD-10-CM | POA: Diagnosis not present

## 2023-08-15 MED ORDER — OMEPRAZOLE 40 MG PO CPDR: 40.0000 mg | DELAYED_RELEASE_CAPSULE | Freq: Every day | ORAL | 3 refills | Status: DC

## 2023-08-15 MED ORDER — HYDROCORTISONE (PERIANAL) 2.5 % EX CREA: 1.0000 | TOPICAL_CREAM | Freq: Two times a day (BID) | CUTANEOUS | 0 refills | Status: AC

## 2023-08-15 MED ORDER — FAMOTIDINE 20 MG PO TABS: 20.0000 mg | ORAL_TABLET | Freq: Every day | ORAL | 3 refills | Status: AC | PRN

## 2023-08-15 NOTE — Progress Notes (Signed)
 Chief Complaint: Follow-up GERD  HPI:    Breanna Montes is an 89 year old female known to Dr. Lavon Paganini, with a past medical history as listed below including reflux, who presents to clinic today for follow-up of reflux.     07/25/2020 EGD with LA grade C esophagitis with no bleeding, a 4 cm hiatal hernia, patchy mild inflammation characterized by congestion, erosions and erythema in the gastric antrum and prepyloric region of the stomach.      10/19/2020 office visit with Dr. Lavon Paganini for follow-up of reflux.  At that time reflux symptoms significantly improved with daily Pantoprazole before breakfast and Pepcid at bedtime.    07/22/2022 patient seen in clinic and at that time is stopped Omeprazole because she has osteoporosis and was worried about side effects.  She has started Apple cider vinegar and honey and it worked just as well as Omeprazole with the occasional Pepcid or Tums.  Did describe some epigastric pain/tenderness which developed under her breasts over the past 3 months.  At that time she was asked to restart Omeprazole as the benefits outweigh the risk given her family history of esophageal cancer and her last EGD results.  She is starting 40 mg every morning.  Also continued on Pepcid 20 mg nightly for the next month.    10/22/2022 office visit with me at that time continued with epigastric pain on Omeprazole 40 mg every morning and Pepcid 20 mg at night and felt like there was always "something in my throat".  Also chronic constipation managed with Senokot x 2 and MiraLAX in the evenings.  At that time scheduled for an EGD in the [facility].  Continue on her bowel regimen and add another dose of MiraLAX daily.  Continue Meprazole 40 mg every morning and restarted Pepcid 20 mg nightly.    12/25/2022 EGD with Z-line regular, 5 cm hiatal hernia, normal stomach, normal duodenum and otherwise normal.  Continue on daily PPI and Pepcid at bedtime as needed.    Today, patient presents to clinic and tells me  she is doing fairly well.  She has figured out how to manage her reflux using Omeprazole 20 mg in the morning before breakfast and then having another lunchtime meal around 1-3 o'clock and then she really just has a snack for dinner.  She is only occasionally using Pepcid 20 mg for breakthrough symptoms.    Does discuss that she continues Senokot and MiraLAX for constipation which works for the most part but she did have a flare of symptoms about a month ago which then flared her hemorrhoids.  Tells me that she feels them externally and has been trying Preparation H ointment but wants to know if there is anything else to use.    In her free time she likes to read murder mysteries.  Meets with friends on Thursdays and they share books.    Denies fever, chills or weight loss.  Past Medical History:  Diagnosis Date   Arthritis    GERD (gastroesophageal reflux disease)    History of hiatal hernia    Osteoporosis    PONV (postoperative nausea and vomiting)     Past Surgical History:  Procedure Laterality Date   APPENDECTOMY     BUNIONECTOMY     left    2005   CATARACT EXTRACTION W/ INTRAOCULAR LENS  IMPLANT, BILATERAL     2009   CERVICAL CONE BIOPSY     cervical cryosurgery     90-95   CHOLECYSTECTOMY  2001   DILATION AND CURETTAGE OF UTERUS     ERCP     2001  internal bleed   FOOT SURGERY     rt  1610+96   HEMORRHOID SURGERY     63+66   HERNIA REPAIR     partial hip repair2017     right   TOTAL KNEE ARTHROPLASTY Right 08/18/2017   Procedure: RIGHT TOTAL KNEE ARTHROPLASTY;  Surgeon: Dannielle Huh, MD;  Location: [facility] OR;  Service: Orthopedics;  Laterality: Right;   TUBAL LIGATION     73    Current Outpatient Medications  Medication Sig Dispense Refill   albuterol (VENTOLIN HFA) 108 (90 Base) MCG/ACT inhaler Inhale 1-2 puffs into the lungs every 6 (six) hours as needed. 8 g 6   Cholecalciferol (VITAMIN D3) 125 MCG (5000 UT) CAPS Take 1 capsule by mouth 2 (two) times a week.      cycloSPORINE (RESTASIS) 0.05 % ophthalmic emulsion Place 1 drop into both eyes as needed. (Patient not taking: Reported on 12/25/2022)     denosumab (PROLIA) 60 MG/ML SOSY injection Inject 60 mg into the skin every 6 (six) months. (Patient not taking: Reported on 10/22/2022)     Docusate Calcium (STOOL SOFTENER PO) Take 1 tablet by mouth daily.     famotidine (PEPCID) 20 MG tablet Take 20 mg by mouth daily as needed for heartburn or indigestion.     Krill Oil 350 MG CAPS Take 1 capsule by mouth daily.     Multiple Vitamins-Minerals (MULTIVITAMIN WITH MINERALS) tablet Take 1 tablet by mouth daily.     omeprazole (PRILOSEC) 40 MG capsule Take 1 capsule by mouth once daily 30 capsule 0   polyethylene glycol (MIRALAX / GLYCOLAX) 17 g packet Take 17 g by mouth daily.     Vitamin D, Ergocalciferol, (DRISDOL) 1.25 MG (50000 UNIT) CAPS capsule Take 1 capsule by mouth once a week.     Current Facility-Administered Medications  Medication Dose Route Frequency Provider Last Rate Last Admin   0.9 %  sodium chloride infusion  500 mL Intravenous Once Napoleon Form, MD        Allergies as of 08/15/2023 - Review Complete 08/15/2023  Allergen Reaction Noted   Codeine Nausea And Vomiting 07/31/2017   Penicillins Rash 07/31/2017    Family History  Problem Relation Age of Onset   Emphysema Mother    Stomach cancer Father    Prostate cancer Sister    Colon cancer Neg Hx    Esophageal cancer Neg Hx    Pancreatic cancer Neg Hx     Social History   Socioeconomic History   Marital status: Single    Spouse name: Not on file   Number of children: 3   Years of education: Not on file   Highest education level: Not on file  Occupational History   Not on file  Tobacco Use   Smoking status: Former    Types: Cigarettes   Smokeless tobacco: Never  Vaping Use   Vaping status: Never Used  Substance and Sexual Activity   Alcohol use: No   Drug use: No   Sexual activity: Not on file  Other Topics  Concern   Not on file  Social History Narrative   Not on file   Social Drivers of Health   Financial Resource Strain: Not on file  Food Insecurity: Not on file  Transportation Needs: Not on file  Physical Activity: Not on file  Stress: Not on file  Social Connections:  Not on file  Intimate Partner Violence: Not on file    Review of Systems:    Constitutional: No weight loss, fever or chills Cardiovascular: No chest pain Respiratory: No SOB  Gastrointestinal: See HPI and otherwise negative   Physical Exam:  Vital signs: BP 118/62   Pulse 82   Ht 5\' 2"  (1.575 m)   Wt 125 lb (56.7 kg)   BMI 22.86 kg/m    Constitutional:   Pleasant Elderly Caucasian female appears to be in NAD, Well developed, Well nourished, alert and cooperative Respiratory: Respirations even and unlabored. Lungs clear to auscultation bilaterally.   No wheezes, crackles, or rhonchi.  Cardiovascular: Normal S1, S2. No MRG. Regular rate and rhythm. No peripheral edema, cyanosis or pallor.  Gastrointestinal:  Soft, nondistended, nontender. No rebound or guarding. Normal bowel sounds. No appreciable masses or hepatomegaly. Rectal: Declined Psychiatric: Oriented to person, place and time. Demonstrates good judgement and reason without abnormal affect or behaviors.  RELEVANT LABS AND IMAGING: CBC    Component Value Date/Time   WBC 12.2 (H) 08/20/2017 0847   RBC 3.93 08/20/2017 0847   HGB 12.2 08/20/2017 0847   HCT 36.2 08/20/2017 0847   PLT 210 08/20/2017 0847   MCV 92.1 08/20/2017 0847   MCH 31.0 08/20/2017 0847   MCHC 33.7 08/20/2017 0847   RDW 12.5 08/20/2017 0847   LYMPHSABS 2.0 08/08/2017 1414   MONOABS 0.5 08/08/2017 1414   EOSABS 0.2 08/08/2017 1414   BASOSABS 0.0 08/08/2017 1414    CMP     Component Value Date/Time   NA 137 08/19/2017 0546   K 4.3 08/19/2017 0546   CL 103 08/19/2017 0546   CO2 25 08/19/2017 0546   GLUCOSE 114 (H) 08/19/2017 0546   BUN 15 08/19/2017 0546   CREATININE  0.76 08/19/2017 0546   CALCIUM 8.8 (L) 08/19/2017 0546   PROT 6.7 08/08/2017 1414   ALBUMIN 4.2 08/08/2017 1414   AST 24 08/08/2017 1414   ALT 13 (L) 08/08/2017 1414   ALKPHOS 86 08/08/2017 1414   BILITOT 0.4 08/08/2017 1414   GFRNONAA >60 08/19/2017 0546   GFRAA >60 08/19/2017 0546    Assessment: 1.  GERD: Controlled on Omeprazole 20 mg every morning and occasional Pepcid 20 mg nightly, recent EGD 12/25/2022 was normal 2.  Chronic constipation: Controlled with Senokot and MiraLAX daily 3.  Hemorrhoids: Per patient external hemorrhoids after a flare of constipation which are bothering her  Plan: 1.  Sent in prescription for Omeprazole 20 mg daily #90 with 3 refills 2.  Sent in prescription for Famotidine 20 mg nightly #90 with 3 refills 3.  Sent in prescription for Hydrocortisone ointment 2.5% to be applied to hemorrhoids twice daily for 1 to 2 weeks. 4.  Continue Senokot and MiraLAX for constipation 5.  Patient to follow in clinic in a year or sooner if necessary.  Hyacinth Meeker, PA-C [facility] 08/15/2023, 10:32 AM  Cc: Laurena Bering, DO

## 2023-08-15 NOTE — Patient Instructions (Addendum)
 We have sent the following medications to your pharmacy for you to pick up at your convenience: Omeprazole  Famotidine  Hydrocortisone   _______________________________________________________  If your blood pressure at your visit was 140/90 or greater, please contact your primary care physician to follow up on this.  _______________________________________________________  If you are age 88 or older, your body mass index should be between 23-30. Your Body mass index is 22.86 kg/m. If this is out of the aforementioned range listed, please consider follow up with your Primary Care Provider.  If you are age 29 or younger, your body mass index should be between 19-25. Your Body mass index is 22.86 kg/m. If this is out of the aformentioned range listed, please consider follow up with your Primary Care Provider.   ________________________________________________________  The [facility] providers would like to encourage you to use [facility] to communicate with providers for non-urgent requests or questions.  Due to long hold times on the telephone, sending your provider a message by [facility] may be a faster and more efficient way to get a response.  Please allow 48 business hours for a response.  Please remember that this is for non-urgent requests.  _______________________________________________________ It was a pleasure to see you today!  Thank you for trusting me with your gastrointestinal care!

## 2023-08-18 ENCOUNTER — Telehealth: Payer: Self-pay | Admitting: Physician Assistant

## 2023-08-18 ENCOUNTER — Other Ambulatory Visit: Payer: Self-pay

## 2023-08-18 MED ORDER — OMEPRAZOLE 20 MG PO CPDR: 20.0000 mg | DELAYED_RELEASE_CAPSULE | Freq: Every day | ORAL | 3 refills | Status: DC

## 2023-08-18 NOTE — Telephone Encounter (Signed)
 PT is calling regarding the dosage for the new prescription for omeprazole. She was told she would be going back to 20mg  but the medication is for 40mg  that she picked up. Requesting to speak with nurse.  Please advise.

## 2024-03-16 ENCOUNTER — Telehealth: Payer: Self-pay | Admitting: Gastroenterology

## 2024-03-16 DIAGNOSIS — K921 Melena: Secondary | ICD-10-CM

## 2024-03-16 NOTE — Telephone Encounter (Signed)
 Spoke with pt. She reports that about 1 week ago she started having sharp stabbing abdominal pain. Went to PCP office and they diagnosed her with diverticulitis. States they started her on a liquid diet which she has been on since. Pt reports that her pain is gone for the most part, she has occasional achy twinges. Reports for the last 2 days, she has had loose stools and they are turning black in color. Denies fever, nausea, vomiting. Positive for fatigue. Pt would like us  to call her back after 12 PM with the recommendations.

## 2024-03-16 NOTE — Telephone Encounter (Signed)
 Spoke with pt, she reports that she went to her GYN today [facility]. Pt reports her provider drew some labs. After reviewing care everywhere, a CBC was ordered. GYN did not draw a CMP. Will route to provider to review and advise.

## 2024-03-16 NOTE — Telephone Encounter (Signed)
 Patient returning call. Please advise

## 2024-03-16 NOTE — Telephone Encounter (Signed)
 Attempted to call pt to discuss provider recommendations. No answer, left vm for pt to return call. Orders entered.

## 2024-03-16 NOTE — Telephone Encounter (Signed)
 Inbound call from patient requesting to speak with a nurse in regards to abdominal pain. Please advise.   Thank you

## 2024-03-17 NOTE — Telephone Encounter (Signed)
 Called pt to discuss recommendations on picking up hemoccult cards on Monday. Discussed reasons for pt to go to the ER before then. Pt verbalized understanding.

## 2024-03-17 NOTE — Telephone Encounter (Signed)
 Results are in care everywhere now. HGB: 13.4 HCT: 38.8 Pt wants to know if based on these results, can she wait until Monday to pick up her hemoccult cards?

## 2024-03-31 ENCOUNTER — Ambulatory Visit: Payer: Self-pay | Admitting: Gastroenterology

## 2024-03-31 ENCOUNTER — Other Ambulatory Visit

## 2024-03-31 DIAGNOSIS — K921 Melena: Secondary | ICD-10-CM

## 2024-03-31 LAB — HEMOCCULT SLIDES (X 3 CARDS)
Fecal Occult Blood: NEGATIVE
OCCULT 1: NEGATIVE
OCCULT 2: NEGATIVE
OCCULT 3: NEGATIVE
OCCULT 4: NEGATIVE
OCCULT 5: NEGATIVE

## 2024-05-04 ENCOUNTER — Telehealth: Payer: Self-pay | Admitting: Physician Assistant

## 2024-05-04 NOTE — Telephone Encounter (Signed)
 Inbound call from patient stating she has been having right side pain since September and is requesting to speak with [facility]. She states she feels like she may have a blockage. Please advise.

## 2024-05-04 NOTE — Telephone Encounter (Signed)
 Patient called stating she is still experiencing abdominal pain. Patient is requesting a call to discuss further. Please advise, thank you

## 2024-05-05 ENCOUNTER — Telehealth: Payer: Self-pay

## 2024-05-05 NOTE — Telephone Encounter (Signed)
 Patient has had this pain off and on for at least a month. She was treated for possible diverticulitis by her PCP late September. She tells me she can only have a bowel movement with a laxative. The pain is in her RLQ. It can wake her up during the night and last for hours. The patient is very concerned she has a blockage of some sort or something else and would like to be seen. Appointment scheduled.

## 2024-05-05 NOTE — Telephone Encounter (Signed)
 Patient contacted. Wants an appointment to be evaluated for the abdominal pain she has been having off and on since September. States she is better today.

## 2024-05-06 NOTE — Progress Notes (Signed)
 Chief Complaint: Right lower quadrant pain  HPI:    Breanna Montes is a 89 year old female, known to Dr. Shila, with a past medical history as listed below including GERD and osteoporosis, who presents to clinic today with a complaint of right lower quadrant pain.     07/25/2020 EGD with LA grade C esophagitis with no bleeding, a 4 cm hiatal hernia, patchy mild inflammation characterized by congestion, erosions and erythema in the gastric antrum and prepyloric region of the stomach.      10/19/2020 office visit with Dr. Nandigam for follow-up of reflux.  At that time reflux symptoms significantly improved with daily Pantoprazole  before breakfast and Pepcid  at bedtime.    07/22/2022 patient seen in clinic and at that time is stopped Omeprazole  because she has osteoporosis and was worried about side effects.  She has started Apple cider vinegar and honey and it worked just as well as Omeprazole  with the occasional Pepcid  or Tums.  Did describe some epigastric pain/tenderness which developed under her breasts over the past 3 months.  At that time she was asked to restart Omeprazole  as the benefits outweigh the risk given her family history of esophageal cancer and her last EGD results.  She is starting 40 mg every morning.  Also continued on Pepcid  20 mg nightly for the next month.    10/22/2022 office visit with me at that time continued with epigastric pain on Omeprazole  40 mg every morning and Pepcid  20 mg at night and felt like there was always something in my throat.  Also chronic constipation managed with Senokot x 2 and MiraLAX in the evenings.  At that time scheduled for an EGD in the [facility].  Continue on her bowel regimen and add another dose of MiraLAX daily.  Continue Meprazole 40 mg every morning and restarted Pepcid  20 mg nightly.    12/25/2022 EGD with Z-line regular, 5 cm hiatal hernia, normal stomach, normal duodenum and otherwise normal.  Continue on daily PPI and Pepcid  at bedtime as needed.     08/15/2023 patient including in clinic and doing well on Omeprazole  20 mg twice daily.  Also occasional Pepcid  20 mg for breakthrough.  Continued Senokot and MiraLAX for constipation which worked.  Discussed hemorrhoids.  Continue Meprazole 20 mg daily and Famotidine  20 mg nightly.  Prescribed Hydrocortisone  ointment to be applied hemorrhoids twice daily.  Continue Senokot and MiraLAX.    03/16/2024 patient called in with abdominal pain.  At that time ordered Hemoccult studies, CBC and CMP.    03/17/2024 hemoglobin 13.4.  Hemoccult cards negative.    05/04/2024 patient continues with right sided pain off-and-on.  Apparently treated by PCP in late September for possible diverticulitis.  Only having a bowel movement in the laxative.  Pain can wake her up at night.    Today, patient presents to clinic and tells me that for the past 2 months she has been having a right lower quadrant pain.  Initially when this started it was a 12/10 but really only lasted for about 24 to 48 hours.  She went to see her PCP who gave her ciprofloxacin.  The pain has decreased since then but continues.  Tells me it is constant that is a discomfort but sometimes it is rated as a 0/10.  Also has sharp shooting pains that tend to come and go just to the right of her umbilicus and lower into her abdomen.  These even sometimes wake her up at night, the last time this  happened was yesterday.  When this occurs she gets up to walk around tries to go to the bathroom but cannot.  Does describe continued constipation, some worse recently.  She is always on Senokot and MiraLAX nightly but sometimes she has a bowel movement sometimes she does not and never feels completely cleaned out.  Cannot tell if having a good bowel movement actually helps with this discomfort or not.    Denies fever, chills, blood in her stool or weight loss.  Past Medical History:  Diagnosis Date   Arthritis    GERD (gastroesophageal reflux disease)    History of hiatal  hernia    Osteoporosis    PONV (postoperative nausea and vomiting)     Past Surgical History:  Procedure Laterality Date   APPENDECTOMY     BUNIONECTOMY     left    2005   CATARACT EXTRACTION W/ INTRAOCULAR LENS  IMPLANT, BILATERAL     2009   CERVICAL CONE BIOPSY     cervical cryosurgery     90-95   CHOLECYSTECTOMY     2001   DILATION AND CURETTAGE OF UTERUS     ERCP     2001  internal bleed   FOOT SURGERY     rt  1981+82   HEMORRHOID SURGERY     63+66   HERNIA REPAIR     partial hip repair2017     right   TOTAL KNEE ARTHROPLASTY Right 08/18/2017   Procedure: RIGHT TOTAL KNEE ARTHROPLASTY;  Surgeon: Rubie Kemps, MD;  Location: [facility] OR;  Service: Orthopedics;  Laterality: Right;   TUBAL LIGATION     73    Current Outpatient Medications  Medication Sig Dispense Refill   albuterol  (VENTOLIN  HFA) 108 (90 Base) MCG/ACT inhaler Inhale 1-2 puffs into the lungs every 6 (six) hours as needed. (Patient not taking: Reported on 08/15/2023) 8 g 6   Cholecalciferol (VITAMIN D3) 125 MCG (5000 UT) CAPS Take 1 capsule by mouth 2 (two) times a week.     cycloSPORINE  (RESTASIS ) 0.05 % ophthalmic emulsion Place 1 drop into both eyes as needed. (Patient not taking: Reported on 08/15/2023)     denosumab  (PROLIA ) 60 MG/ML SOSY injection Inject 60 mg into the skin every 6 (six) months. (Patient not taking: Reported on 08/15/2023)     Docusate Calcium (STOOL SOFTENER PO) Take 1 tablet by mouth daily.     famotidine  (PEPCID ) 20 MG tablet Take 1 tablet (20 mg total) by mouth daily as needed for heartburn or indigestion. 90 tablet 3   hydrocortisone  (ANUSOL -HC) 2.5 % rectal cream Place 1 Application rectally 2 (two) times daily. 25 g 0   Krill Oil 350 MG CAPS Take 1 capsule by mouth daily.     Multiple Vitamins-Minerals (MULTIVITAMIN WITH MINERALS) tablet Take 1 tablet by mouth daily.     omeprazole  (PRILOSEC) 20 MG capsule Take 1 capsule (20 mg total) by mouth daily. 90 capsule 3   polyethylene glycol  (MIRALAX / GLYCOLAX) 17 g packet Take 17 g by mouth daily.     Current Facility-Administered Medications  Medication Dose Route Frequency Provider Last Rate Last Admin   0.9 %  sodium chloride  infusion  500 mL Intravenous Once Nandigam, Kavitha V, MD        Allergies as of 05/07/2024 - Review Complete 08/15/2023  Allergen Reaction Noted   Codeine Nausea And Vomiting 07/31/2017   Penicillins Rash 07/31/2017    Family History  Problem Relation Age of Onset  Emphysema Mother    Stomach cancer Father    Prostate cancer Sister    Colon cancer Neg Hx    Esophageal cancer Neg Hx    Pancreatic cancer Neg Hx     Social History   Socioeconomic History   Marital status: Single    Spouse name: Not on file   Number of children: 3   Years of education: Not on file   Highest education level: Not on file  Occupational History   Not on file  Tobacco Use   Smoking status: Former    Types: Cigarettes   Smokeless tobacco: Never  Vaping Use   Vaping status: Never Used  Substance and Sexual Activity   Alcohol use: No   Drug use: No   Sexual activity: Not on file  Other Topics Concern   Not on file  Social History Narrative   Not on file   Social Drivers of Health   Financial Resource Strain: Not on file  Food Insecurity: Not on file  Transportation Needs: Not on file  Physical Activity: Not on file  Stress: Not on file  Social Connections: Not on file  Intimate Partner Violence: Not on file    Review of Systems:    Constitutional: No weight loss, fever or chills Cardiovascular: No chest pain Respiratory: No SOB Gastrointestinal: See HPI and otherwise negative   Physical Exam:  Vital signs: BP 124/72 (BP Location: Right Arm, Patient Position: Sitting, Cuff Size: Normal)   Pulse 85   Ht 5' 2 (1.575 m)   Wt 123 lb 6 oz (56 kg)   BMI 22.57 kg/m    Constitutional:   Pleasant elderly Caucasian female appears to be in NAD, Well developed, Well nourished, alert and  cooperative Respiratory: Respirations even and unlabored. Lungs clear to auscultation bilaterally.   No wheezes, crackles, or rhonchi.  Cardiovascular: Normal S1, S2. No MRG. Regular rate and rhythm. No peripheral edema, cyanosis or pallor.  Gastrointestinal:  Soft, nondistended, moderate right lower quadrant TTP with some involuntary guarding, normal bowel sounds. No appreciable masses or hepatomegaly. Rectal:  Not performed.  Psychiatric: Demonstrates good judgement and reason without abnormal affect or behaviors.  No recent labs or imaging.  Assessment: 1.  Right sided abdominal pain: Started 2 months ago, saw PCP who treated empirically for diverticulitis with ciprofloxacin, pain did decrease but continues and even continues to wake her up at night at times, tender on exam today; concern for inflammatory versus infectious cause 2.  Constipation: Chronic for the patient but worsened lately, typically on Senokot and MiraLAX nightly which keeps her having a bowel movement every few days, but never feels completely emptied; consider slow transit  Plan: 1.  Prescribed Linzess  72 mcg daily for now.  Patient is able to pass stool and gas so not worried about an obstruction.  Prescribed #30 with 5 refills, recommend taking this 30 minutes before breakfast 2.  Ordered a CT of the abdomen pelvis today with contrast 3.  Ordered labs to include a CBC and CMP 4.  Patient to follow in clinic per recommendations after imaging and labs above.  Breanna Failing, PA-C [facility] 05/06/2024, 11:33 AM

## 2024-05-07 ENCOUNTER — Other Ambulatory Visit (INDEPENDENT_AMBULATORY_CARE_PROVIDER_SITE_OTHER)

## 2024-05-07 ENCOUNTER — Ambulatory Visit: Admitting: Physician Assistant

## 2024-05-07 ENCOUNTER — Encounter: Payer: Self-pay | Admitting: Physician Assistant

## 2024-05-07 VITALS — BP 124/72 | HR 85 | Ht 62.0 in | Wt 123.4 lb

## 2024-05-07 DIAGNOSIS — R1031 Right lower quadrant pain: Secondary | ICD-10-CM

## 2024-05-07 DIAGNOSIS — K5900 Constipation, unspecified: Secondary | ICD-10-CM

## 2024-05-07 LAB — COMPREHENSIVE METABOLIC PANEL WITH GFR
ALT: 9 U/L (ref 0–35)
AST: 13 U/L (ref 0–37)
Albumin: 4.4 g/dL (ref 3.5–5.2)
Alkaline Phosphatase: 95 U/L (ref 39–117)
BUN: 14 mg/dL (ref 6–23)
CO2: 28 meq/L (ref 19–32)
Calcium: 9.1 mg/dL (ref 8.4–10.5)
Chloride: 106 meq/L (ref 96–112)
Creatinine, Ser: 0.74 mg/dL (ref 0.40–1.20)
GFR: 72.28 mL/min (ref >60.00)
Glucose, Bld: 93 mg/dL (ref 70–99)
Potassium: 3.9 meq/L (ref 3.5–5.1)
Sodium: 141 meq/L (ref 135–145)
Total Bilirubin: 0.5 mg/dL (ref 0.2–1.2)
Total Protein: 6.9 g/dL (ref 6.0–8.3)

## 2024-05-07 LAB — CBC WITH DIFFERENTIAL/PLATELET
Basophils Absolute: 0.1 K/uL (ref 0.0–0.1)
Basophils Relative: 1.3 % (ref 0.0–3.0)
Eosinophils Absolute: 0.1 K/uL (ref 0.0–0.7)
Eosinophils Relative: 2.1 % (ref 0.0–5.0)
HCT: 38.9 % (ref 36.0–46.0)
Hemoglobin: 13.1 g/dL (ref 12.0–15.0)
Lymphocytes Relative: 30.5 % (ref 12.0–46.0)
Lymphs Abs: 1.9 K/uL (ref 0.7–4.0)
MCHC: 33.6 g/dL (ref 30.0–36.0)
MCV: 92.5 fl (ref 78.0–100.0)
Monocytes Absolute: 0.5 K/uL (ref 0.1–1.0)
Monocytes Relative: 8.2 % (ref 3.0–12.0)
Neutro Abs: 3.6 K/uL (ref 1.4–7.7)
Neutrophils Relative %: 57.9 % (ref 43.0–77.0)
Platelets: 251 K/uL (ref 150.0–400.0)
RBC: 4.21 Mil/uL (ref 3.87–5.11)
RDW: 12.9 % (ref 11.5–15.5)
WBC: 6.2 K/uL (ref 4.0–10.5)

## 2024-05-07 MED ORDER — LINACLOTIDE 72 MCG PO CAPS: 72.0000 ug | ORAL_CAPSULE | Freq: Every day | ORAL | 5 refills | Status: AC

## 2024-05-07 NOTE — Patient Instructions (Signed)
 Your provider has requested that you go to the basement level for lab work before leaving today. Press B on the elevator. The lab is located at the first door on the left as you exit the elevator.  We have sent the following medications to your pharmacy for you to pick up at your convenience:  Linzess   You have been scheduled for a CT scan of the abdomen and pelvis at [facility], 1st floor Radiology. You are scheduled on 05/10/2024 at 4:00pm. You should arrive at 1:45pm minutes prior to your appointment time for registration.   You may take any medications as prescribed with a small amount of water, if necessary. If you take any of the following medications: METFORMIN, GLUCOPHAGE, GLUCOVANCE, AVANDAMET, RIOMET, FORTAMET, ACTOPLUS MET, JANUMET, GLUMETZA or METAGLIP, you MAY be asked to HOLD this medication 48 hours AFTER the exam.   Do not eat any solids for four hours prior to your test.   If you have any questions regarding your exam or if you need to reschedule, you may call [facility] at [phone] between the hours of 8:00 am and 5:00 pm, Monday-Friday.

## 2024-05-10 ENCOUNTER — Ambulatory Visit: Payer: Self-pay | Admitting: Physician Assistant

## 2024-05-10 ENCOUNTER — Ambulatory Visit (HOSPITAL_COMMUNITY)
Admission: RE | Admit: 2024-05-10 | Discharge: 2024-05-10 | Disposition: A | Discharge status: Home / Self Care | Source: Ambulatory Visit | Attending: Physician Assistant | Admitting: Physician Assistant

## 2024-05-10 DIAGNOSIS — K5900 Constipation, unspecified: Secondary | ICD-10-CM | POA: Diagnosis present

## 2024-05-10 DIAGNOSIS — R1031 Right lower quadrant pain: Secondary | ICD-10-CM | POA: Diagnosis present

## 2024-05-10 MED ORDER — IOHEXOL 300 MG/ML  SOLN
100.0000 mL | Freq: Once | INTRAMUSCULAR | Status: AC | PRN
  Administered 2024-05-10: 100 mL via INTRAVENOUS

## 2024-05-10 MED ORDER — IOHEXOL 9 MG/ML PO SOLN
1000.0000 mL | ORAL | Status: AC
  Administered 2024-05-10: 1000 mL via ORAL

## 2024-05-10 MED ORDER — SODIUM CHLORIDE (PF) 0.9 % IJ SOLN
INTRAMUSCULAR | Status: AC
  Filled 2024-05-10: qty 50

## 2024-05-20 ENCOUNTER — Ambulatory Visit: Admitting: Physician Assistant

## 2024-07-20 ENCOUNTER — Other Ambulatory Visit: Payer: Self-pay | Admitting: Physician Assistant
